# Patient Record
Sex: Female | Born: 1985
Health system: Southern US, Community
[De-identification: ages and names within clinical notes are randomized; demographics above are authoritative.]

## PROBLEM LIST (undated history)

## (undated) ENCOUNTER — Inpatient Hospital Stay (HOSPITAL_COMMUNITY): Payer: Self-pay

## (undated) DIAGNOSIS — Z3689 Encounter for other specified antenatal screening: Secondary | ICD-10-CM

## (undated) DIAGNOSIS — I10 Essential (primary) hypertension: Secondary | ICD-10-CM

## (undated) DIAGNOSIS — O4100X0 Oligohydramnios, unspecified trimester, not applicable or unspecified: Secondary | ICD-10-CM

## (undated) DIAGNOSIS — N390 Urinary tract infection, site not specified: Secondary | ICD-10-CM

## (undated) DIAGNOSIS — Z8619 Personal history of other infectious and parasitic diseases: Secondary | ICD-10-CM

## (undated) DIAGNOSIS — O139 Gestational [pregnancy-induced] hypertension without significant proteinuria, unspecified trimester: Secondary | ICD-10-CM

## (undated) HISTORY — DX: Personal history of other infectious and parasitic diseases: Z86.19

## (undated) HISTORY — PX: MOUTH SURGERY: SHX715

## (undated) HISTORY — PX: WISDOM TOOTH EXTRACTION: SHX21

---
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
## 2012-10-07 NOTE — L&D Delivery Note (Signed)
Delivery Note At 1:44 PM a viable and healthy female was delivered via  (Presentation:Direct Occiput ; Anterior ).  APGAR:9, 9; weight pending.   Placenta status: Spontaneous, Intact.  Cord: 3V cord  Anesthesia:  Epidural Episiotomy:   None Lacerations: 2nd degree right labial Suture Repair: 3.0 vicryl & 4-0 vicryl Est. Blood Loss (mL): 300cc  Mom to postpartum.  Baby to nursery-stable.  [name] H. 05/10/2013, 2:14 PM

## 2012-10-22 LAB — OB RESULTS CONSOLE HEPATITIS B SURFACE ANTIGEN: Hepatitis B Surface Ag: NEGATIVE

## 2012-10-22 LAB — OB RESULTS CONSOLE ABO/RH: RH Type: POSITIVE

## 2012-10-22 LAB — OB RESULTS CONSOLE ANTIBODY SCREEN: Antibody Screen: NEGATIVE

## 2012-10-22 LAB — OB RESULTS CONSOLE HIV ANTIBODY (ROUTINE TESTING): HIV: NONREACTIVE

## 2012-10-22 LAB — OB RESULTS CONSOLE GC/CHLAMYDIA
Chlamydia: NEGATIVE
Gonorrhea: NEGATIVE

## 2012-10-22 LAB — OB RESULTS CONSOLE RUBELLA ANTIBODY, IGM: Rubella: IMMUNE

## 2012-10-22 LAB — OB RESULTS CONSOLE RPR: RPR: NONREACTIVE

## 2013-04-02 LAB — OB RESULTS CONSOLE GBS: GBS: NEGATIVE

## 2013-05-01 ENCOUNTER — Inpatient Hospital Stay (HOSPITAL_COMMUNITY): Admission: AD | Admit: 2013-05-01 | Payer: Self-pay | Source: Ambulatory Visit | Admitting: Obstetrics and Gynecology

## 2013-05-05 ENCOUNTER — Telehealth (HOSPITAL_COMMUNITY): Payer: Self-pay | Admitting: *Deleted

## 2013-05-05 ENCOUNTER — Encounter (HOSPITAL_COMMUNITY): Payer: Self-pay | Admitting: *Deleted

## 2013-05-05 NOTE — Telephone Encounter (Signed)
Preadmission screen  

## 2013-05-09 ENCOUNTER — Encounter (HOSPITAL_COMMUNITY): Payer: Self-pay

## 2013-05-09 ENCOUNTER — Inpatient Hospital Stay (HOSPITAL_COMMUNITY)
Admission: RE | Admit: 2013-05-09 | Discharge: 2013-05-11 | DRG: 775 | Disposition: A | Discharge status: Home / Self Care | Payer: 59 | Source: Ambulatory Visit | Attending: Obstetrics and Gynecology | Admitting: Obstetrics and Gynecology

## 2013-05-09 VITALS — BP 122/81 | HR 71 | Temp 97.9°F | Resp 18 | Ht 67.0 in | Wt 195.0 lb

## 2013-05-09 DIAGNOSIS — Z3480 Encounter for supervision of other normal pregnancy, unspecified trimester: Secondary | ICD-10-CM

## 2013-05-09 DIAGNOSIS — O480 Post-term pregnancy: Principal | ICD-10-CM | POA: Diagnosis present

## 2013-05-09 LAB — COMPREHENSIVE METABOLIC PANEL
ALT: 7 U/L (ref 0–35)
AST: 14 U/L (ref 0–37)
Alkaline Phosphatase: 186 U/L — ABNORMAL HIGH (ref 39–117)
CO2: 18 mEq/L — ABNORMAL LOW (ref 19–32)
Calcium: 9.4 mg/dL (ref 8.4–10.5)
Glucose, Bld: 84 mg/dL (ref 70–99)
Potassium: 3.7 mEq/L (ref 3.5–5.1)
Sodium: 131 mEq/L — ABNORMAL LOW (ref 135–145)
Total Protein: 6.5 g/dL (ref 6.0–8.3)

## 2013-05-09 LAB — CBC
HCT: 33.5 % — ABNORMAL LOW (ref 36.0–46.0)
Hemoglobin: 11.3 g/dL — ABNORMAL LOW (ref 12.0–15.0)
MCHC: 33.7 g/dL (ref 30.0–36.0)
WBC: 13.8 10*3/uL — ABNORMAL HIGH (ref 4.0–10.5)

## 2013-05-09 MED ORDER — LACTATED RINGERS IV SOLN
INTRAVENOUS | Status: DC
  Administered 2013-05-09: 20:00:00 via INTRAVENOUS

## 2013-05-09 MED ORDER — TERBUTALINE SULFATE 1 MG/ML IJ SOLN: 0.2500 mg | Freq: Once | INTRAMUSCULAR | Status: AC | PRN

## 2013-05-09 MED ORDER — OXYTOCIN BOLUS FROM INFUSION: 500.0000 mL | INTRAVENOUS | Status: DC

## 2013-05-09 MED ORDER — IBUPROFEN 600 MG PO TABS: 600.0000 mg | ORAL_TABLET | Freq: Four times a day (QID) | ORAL | Status: DC | PRN

## 2013-05-09 MED ORDER — OXYCODONE-ACETAMINOPHEN 5-325 MG PO TABS: 1.0000 | ORAL_TABLET | ORAL | Status: DC | PRN

## 2013-05-09 MED ORDER — CITRIC ACID-SODIUM CITRATE 334-500 MG/5ML PO SOLN: 30.0000 mL | ORAL | Status: DC | PRN

## 2013-05-09 MED ORDER — LIDOCAINE HCL (PF) 1 % IJ SOLN
30.0000 mL | INTRAMUSCULAR | Status: DC | PRN
  Administered 2013-05-10: 30 mL via SUBCUTANEOUS
  Filled 2013-05-09 (×2): qty 30

## 2013-05-09 MED ORDER — ONDANSETRON HCL 4 MG/2ML IJ SOLN: 4.0000 mg | Freq: Four times a day (QID) | INTRAMUSCULAR | Status: DC | PRN

## 2013-05-09 MED ORDER — MISOPROSTOL 25 MCG QUARTER TABLET
25.0000 ug | ORAL_TABLET | ORAL | Status: DC | PRN
  Administered 2013-05-09: 25 ug via VAGINAL
  Filled 2013-05-09: qty 0.25
  Filled 2013-05-09: qty 1

## 2013-05-09 MED ORDER — LACTATED RINGERS IV SOLN: 500.0000 mL | INTRAVENOUS | Status: DC | PRN

## 2013-05-09 MED ORDER — FLEET ENEMA 7-19 GM/118ML RE ENEM: 1.0000 | ENEMA | RECTAL | Status: DC | PRN

## 2013-05-09 MED ORDER — ZOLPIDEM TARTRATE 5 MG PO TABS: 5.0000 mg | ORAL_TABLET | Freq: Every evening | ORAL | Status: DC | PRN

## 2013-05-09 MED ORDER — BUTORPHANOL TARTRATE 1 MG/ML IJ SOLN
1.0000 mg | INTRAMUSCULAR | Status: DC | PRN
  Administered 2013-05-10: 1 mg via INTRAVENOUS
  Filled 2013-05-09: qty 1

## 2013-05-09 MED ORDER — OXYTOCIN 40 UNITS IN LACTATED RINGERS INFUSION - SIMPLE MED: 62.5000 mL/h | INTRAVENOUS | Status: DC

## 2013-05-09 MED ORDER — ACETAMINOPHEN 325 MG PO TABS: 650.0000 mg | ORAL_TABLET | ORAL | Status: DC | PRN

## 2013-05-09 NOTE — H&P (Signed)
27 y.o. [redacted]w[redacted]d  G1P0 comes in for post dates induction.  Otherwise has good fetal movement and no bleeding.  Past Medical History  Diagnosis Date  . Hx of varicella     Past Surgical History  Procedure Laterality Date  . Mouth surgery      OB History   Grav Para Term Preterm Abortions TAB SAB Ect Mult Living   1              # Outc Date GA Lbr Len/2nd Wgt Sex Del Anes PTL Lv   1 CUR               History   Social History  . Marital Status: Married    Spouse Name: N/A    Number of Children: N/A  . Years of Education: N/A   Occupational History  . Not on file.   Social History Main Topics  . Smoking status: Never Smoker   . Smokeless tobacco: Never Used  . Alcohol Use: No  . Drug Use: No  . Sexually Active: Yes   Other Topics Concern  . Not on file   Social History Narrative  . No narrative on file   Minocycline    Prenatal Transfer Tool  Maternal Diabetes: No Genetic Screening: Normal Maternal Ultrasounds/Referrals: Normal Fetal Ultrasounds or other Referrals:  None Maternal Substance Abuse:  No Significant Maternal Medications:  None Significant Maternal Lab Results: None  Other [id number].    Filed Vitals:   05/09/13 1951 05/09/13 1955 05/09/13 2046  BP: 148/90  137/91  Pulse: 90  82  Temp: 97.9 F (36.6 C)    Resp: 20  20  Height:  5\' 7"  (1.702 m)   Weight:  88.451 kg (195 lb)     Lungs/Cor:  NAD Abdomen:  soft, gravid Ex:  no cords, erythema SVE:  FT/long/closed FHTs:  120, good STV, NST R Toco:  q5-10   A/P   Post dates induction. Now mildly elevated BPs.  Check PIH labs.    GBS neg.  Treanna Dumler A

## 2013-05-10 ENCOUNTER — Inpatient Hospital Stay (HOSPITAL_COMMUNITY): Payer: 59 | Admitting: Anesthesiology

## 2013-05-10 ENCOUNTER — Encounter (HOSPITAL_COMMUNITY): Payer: Self-pay

## 2013-05-10 ENCOUNTER — Encounter (HOSPITAL_COMMUNITY): Payer: Self-pay | Admitting: Anesthesiology

## 2013-05-10 LAB — RPR: RPR Ser Ql: NONREACTIVE

## 2013-05-10 MED ORDER — METHYLERGONOVINE MALEATE 0.2 MG/ML IJ SOLN: 0.2000 mg | INTRAMUSCULAR | Status: DC | PRN

## 2013-05-10 MED ORDER — DIPHENHYDRAMINE HCL 50 MG/ML IJ SOLN: 12.5000 mg | INTRAMUSCULAR | Status: DC | PRN

## 2013-05-10 MED ORDER — LANOLIN HYDROUS EX OINT: TOPICAL_OINTMENT | CUTANEOUS | Status: DC | PRN

## 2013-05-10 MED ORDER — OXYTOCIN 40 UNITS IN LACTATED RINGERS INFUSION - SIMPLE MED
1.0000 m[IU]/min | INTRAVENOUS | Status: DC
  Administered 2013-05-10: 2 m[IU]/min via INTRAVENOUS
  Filled 2013-05-10: qty 1000

## 2013-05-10 MED ORDER — TETANUS-DIPHTH-ACELL PERTUSSIS 5-2.5-18.5 LF-MCG/0.5 IM SUSP
0.5000 mL | Freq: Once | INTRAMUSCULAR | Status: AC
  Administered 2013-05-11: 0.5 mL via INTRAMUSCULAR
  Filled 2013-05-10: qty 0.5

## 2013-05-10 MED ORDER — DIPHENHYDRAMINE HCL 25 MG PO CAPS: 25.0000 mg | ORAL_CAPSULE | Freq: Four times a day (QID) | ORAL | Status: DC | PRN

## 2013-05-10 MED ORDER — IBUPROFEN 600 MG PO TABS
600.0000 mg | ORAL_TABLET | Freq: Four times a day (QID) | ORAL | Status: DC
  Administered 2013-05-10 – 2013-05-11 (×4): 600 mg via ORAL
  Filled 2013-05-10 (×4): qty 1

## 2013-05-10 MED ORDER — PHENYLEPHRINE 40 MCG/ML (10ML) SYRINGE FOR IV PUSH (FOR BLOOD PRESSURE SUPPORT)
80.0000 ug | PREFILLED_SYRINGE | INTRAVENOUS | Status: DC | PRN
  Filled 2013-05-10: qty 2

## 2013-05-10 MED ORDER — BENZOCAINE-MENTHOL 20-0.5 % EX AERO
1.0000 "application " | INHALATION_SPRAY | CUTANEOUS | Status: DC | PRN
  Administered 2013-05-10 – 2013-05-11 (×2): 1 via TOPICAL
  Filled 2013-05-10 (×2): qty 56

## 2013-05-10 MED ORDER — DIBUCAINE 1 % RE OINT: 1.0000 "application " | TOPICAL_OINTMENT | RECTAL | Status: DC | PRN

## 2013-05-10 MED ORDER — SENNOSIDES-DOCUSATE SODIUM 8.6-50 MG PO TABS
2.0000 | ORAL_TABLET | Freq: Every day | ORAL | Status: DC
  Administered 2013-05-10: 2 via ORAL

## 2013-05-10 MED ORDER — FENTANYL 2.5 MCG/ML BUPIVACAINE 1/10 % EPIDURAL INFUSION (WH - ANES)
14.0000 mL/h | INTRAMUSCULAR | Status: DC | PRN
  Administered 2013-05-10: 14 mL/h via EPIDURAL
  Filled 2013-05-10: qty 125

## 2013-05-10 MED ORDER — WITCH HAZEL-GLYCERIN EX PADS: 1.0000 "application " | MEDICATED_PAD | CUTANEOUS | Status: DC | PRN

## 2013-05-10 MED ORDER — SODIUM BICARBONATE 8.4 % IV SOLN
INTRAVENOUS | Status: DC | PRN
  Administered 2013-05-10: 5 mL via EPIDURAL

## 2013-05-10 MED ORDER — EPHEDRINE 5 MG/ML INJ
10.0000 mg | INTRAVENOUS | Status: DC | PRN
  Filled 2013-05-10: qty 2
  Filled 2013-05-10: qty 4

## 2013-05-10 MED ORDER — LACTATED RINGERS IV SOLN: 500.0000 mL | Freq: Once | INTRAVENOUS | Status: DC

## 2013-05-10 MED ORDER — PHENYLEPHRINE 40 MCG/ML (10ML) SYRINGE FOR IV PUSH (FOR BLOOD PRESSURE SUPPORT)
80.0000 ug | PREFILLED_SYRINGE | INTRAVENOUS | Status: DC | PRN
  Filled 2013-05-10: qty 5
  Filled 2013-05-10: qty 2

## 2013-05-10 MED ORDER — PRENATAL MULTIVITAMIN CH
1.0000 | ORAL_TABLET | Freq: Every day | ORAL | Status: DC
  Administered 2013-05-11: 1 via ORAL
  Filled 2013-05-10: qty 1

## 2013-05-10 MED ORDER — ONDANSETRON HCL 4 MG/2ML IJ SOLN: 4.0000 mg | INTRAMUSCULAR | Status: DC | PRN

## 2013-05-10 MED ORDER — SIMETHICONE 80 MG PO CHEW: 80.0000 mg | CHEWABLE_TABLET | ORAL | Status: DC | PRN

## 2013-05-10 MED ORDER — METHYLERGONOVINE MALEATE 0.2 MG PO TABS: 0.2000 mg | ORAL_TABLET | ORAL | Status: DC | PRN

## 2013-05-10 MED ORDER — OXYCODONE-ACETAMINOPHEN 5-325 MG PO TABS: 1.0000 | ORAL_TABLET | ORAL | Status: DC | PRN

## 2013-05-10 MED ORDER — EPHEDRINE 5 MG/ML INJ
10.0000 mg | INTRAVENOUS | Status: DC | PRN
  Filled 2013-05-10: qty 2

## 2013-05-10 MED ORDER — ZOLPIDEM TARTRATE 5 MG PO TABS: 5.0000 mg | ORAL_TABLET | Freq: Every evening | ORAL | Status: DC | PRN

## 2013-05-10 MED ORDER — ONDANSETRON HCL 4 MG PO TABS: 4.0000 mg | ORAL_TABLET | ORAL | Status: DC | PRN

## 2013-05-10 NOTE — Progress Notes (Signed)
Patient ID: Peggy Lester, female   DOB: 07-14-1986, 27 y.o.   MRN: [id number]  S: Comfortable after epidural placement O:  Filed Vitals:   05/10/13 0430 05/10/13 0630 05/10/13 0807 05/10/13 0812  BP:  141/77 132/85   Pulse:  89 104 92  Temp:      Resp: 20 20    Height:      Weight:      SpO2:    98%   AOx3, NAD Gravid soft FHT 120-130 Reactive with accelerations, category I CVx 5-6/90/-2 Toco: Q3-4  AP 1) AROM clear fluid 2) Continue pit 3) FWB reassuring

## 2013-05-10 NOTE — Progress Notes (Signed)
FHTs 120s gstv, NST R  Toco q5  BPs 130s-140s/90s  Results for orders placed during the hospital encounter of 05/09/13 (from the past 24 hour(s))  CBC     Status: Abnormal   Collection Time    05/09/13  8:00 PM      Result Value Range   WBC 13.8 (*) 4.0 - 10.5 K/uL   RBC 4.05  3.87 - 5.11 MIL/uL   Hemoglobin 11.3 (*) 12.0 - 15.0 g/dL   HCT 16.1 (*) 09.6 - 04.5 %   MCV 82.7  78.0 - 100.0 fL   MCH 27.9  26.0 - 34.0 pg   MCHC 33.7  30.0 - 36.0 g/dL   RDW 40.9  81.1 - 91.4 %   Platelets 207  150 - 400 K/uL  COMPREHENSIVE METABOLIC PANEL     Status: Abnormal   Collection Time    05/09/13  8:00 PM      Result Value Range   Sodium 131 (*) 135 - 145 mEq/L   Potassium 3.7  3.5 - 5.1 mEq/L   Chloride 100  96 - 112 mEq/L   CO2 18 (*) 19 - 32 mEq/L   Glucose, Bld 84  70 - 99 mg/dL   BUN 4 (*) 6 - 23 mg/dL   Creatinine, Ser 7.82  0.50 - 1.10 mg/dL   Calcium 9.4  8.4 - 95.6 mg/dL   Total Protein 6.5  6.0 - 8.3 g/dL   Albumin 2.9 (*) 3.5 - 5.2 g/dL   AST 14  0 - 37 U/L   ALT 7  0 - 35 U/L   Alkaline Phosphatase 186 (*) 39 - 117 U/L   Total Bilirubin 0.2 (*) 0.3 - 1.2 mg/dL   GFR calc non Af Amer >90  >90 mL/min   GFR calc Af Amer >90  >90 mL/min  URIC ACID     Status: None   Collection Time    05/09/13  8:00 PM      Result Value Range   Uric Acid, Serum 5.0  2.4 - 7.0 mg/dL   Labs and BPs stable.  Pit at 0500.

## 2013-05-10 NOTE — Anesthesia Procedure Notes (Signed)

## 2013-05-10 NOTE — Anesthesia Preprocedure Evaluation (Signed)

## 2013-05-11 LAB — CBC
Hemoglobin: 9.3 g/dL — ABNORMAL LOW (ref 12.0–15.0)
MCHC: 33.2 g/dL (ref 30.0–36.0)
Platelets: 183 10*3/uL (ref 150–400)
RDW: 14.3 % (ref 11.5–15.5)

## 2013-05-11 NOTE — Anesthesia Postprocedure Evaluation (Signed)
  Anesthesia Post-op Note  Patient: Peggy Lester  Procedure(s) Performed: * No procedures listed *  Patient Location: Mother/Baby  Anesthesia Type:Epidural  Level of Consciousness: awake  Airway and Oxygen Therapy: Patient Spontanous Breathing  Post-op Pain: none  Post-op Assessment: Patient's Cardiovascular Status Stable, Respiratory Function Stable, Patent Airway, No signs of Nausea or vomiting, Adequate PO intake, Pain level controlled, No headache, No backache, No residual numbness and No residual motor weakness  Post-op Vital Signs: Reviewed and stable  Complications: No apparent anesthesia complications

## 2013-05-11 NOTE — Discharge Summary (Signed)
Obstetric Discharge Summary Reason for Admission: induction of labor Prenatal Procedures: ultrasound Intrapartum Procedures: spontaneous vaginal delivery Postpartum Procedures: none Complications-Operative and Postpartum: 2 degree perineal laceration Hemoglobin  Date Value Range Status  05/11/2013 9.3* 12.0 - 15.0 g/dL Final     DELTA CHECK NOTED     REPEATED TO VERIFY     HCT  Date Value Range Status  05/11/2013 28.0* 36.0 - 46.0 % Final    Physical Exam:  General: alert Lochia: appropriate Uterine Fundus: firm   Discharge Diagnoses: Term Pregnancy-delivered  Discharge Information: Date: 05/11/2013 Activity: pelvic rest Diet: routine Medications: PNV and Ibuprofen Condition: stable Instructions: refer to practice specific booklet Discharge to: home Follow-up Information   Follow up with Almon Hercules., MD. Schedule an appointment as soon as possible for a visit in 4 weeks.   Contact information:   [address] [place] [address] [phone]       Newborn Data: Live born female  Birth Weight: 7 lb 6 oz (3345 g) APGAR: 9, 9  Home with mother.  Atharv Barriere E 05/11/2013, 8:56 AM

## 2013-05-11 NOTE — Progress Notes (Signed)
PPD#1 Pt would like to go home. Baby and mother doing well. VSSAf IMP/ stable PLAN/ Will discharge.

## 2014-08-08 ENCOUNTER — Encounter (HOSPITAL_COMMUNITY): Payer: Self-pay

## 2016-09-03 DIAGNOSIS — Z3480 Encounter for supervision of other normal pregnancy, unspecified trimester: Secondary | ICD-10-CM | POA: Diagnosis not present

## 2016-09-03 DIAGNOSIS — Z01419 Encounter for gynecological examination (general) (routine) without abnormal findings: Secondary | ICD-10-CM | POA: Diagnosis not present

## 2016-09-03 DIAGNOSIS — Z6829 Body mass index (BMI) 29.0-29.9, adult: Secondary | ICD-10-CM | POA: Diagnosis not present

## 2016-09-03 DIAGNOSIS — Z124 Encounter for screening for malignant neoplasm of cervix: Secondary | ICD-10-CM | POA: Diagnosis not present

## 2016-09-03 DIAGNOSIS — N925 Other specified irregular menstruation: Secondary | ICD-10-CM | POA: Diagnosis not present

## 2016-09-05 MED FILL — CEPHALEXIN 500 MG CAPSULE: 500 | 7 days supply | Qty: 14 | Fill #0

## 2016-09-26 DIAGNOSIS — Z3481 Encounter for supervision of other normal pregnancy, first trimester: Secondary | ICD-10-CM | POA: Diagnosis not present

## 2016-09-26 DIAGNOSIS — Z3480 Encounter for supervision of other normal pregnancy, unspecified trimester: Secondary | ICD-10-CM | POA: Diagnosis not present

## 2016-09-26 LAB — OB RESULTS CONSOLE RPR: RPR: NONREACTIVE

## 2016-09-26 LAB — OB RESULTS CONSOLE GC/CHLAMYDIA
CHLAMYDIA, DNA PROBE: NEGATIVE
Gonorrhea: NEGATIVE

## 2016-09-26 LAB — OB RESULTS CONSOLE ABO/RH: RH TYPE: POSITIVE

## 2016-09-26 LAB — OB RESULTS CONSOLE HEPATITIS B SURFACE ANTIGEN: HEP B S AG: NEGATIVE

## 2016-09-26 LAB — OB RESULTS CONSOLE RUBELLA ANTIBODY, IGM: RUBELLA: IMMUNE

## 2016-09-26 LAB — OB RESULTS CONSOLE HIV ANTIBODY (ROUTINE TESTING): HIV: NONREACTIVE

## 2016-09-26 LAB — OB RESULTS CONSOLE ANTIBODY SCREEN: Antibody Screen: NEGATIVE

## 2016-09-27 MED FILL — NIFEDIPINE ER 30 MG TABLET: 30 | 30 days supply | Qty: 30 | Fill #0

## 2016-10-07 NOTE — L&D Delivery Note (Signed)
Pt was admitted last pm for an induction secondary to HTN and low amniotic fluid. She was given 2 doses of cytotec. In the am she had an amniotomy with clear fluid. Pit aug was started. She progressed slowly to 3cm then rapidly completed the first stage. She pushed 3 times and had a SVD of one live viable white  infant over an intact perineum. Placenta-S/I. EBL-400cc. Baby to NBN. No complications.

## 2016-10-28 MED FILL — NIFEDIPINE ER 30 MG TABLET: 30 | 30 days supply | Qty: 30 | Fill #1

## 2016-10-30 DIAGNOSIS — Z3480 Encounter for supervision of other normal pregnancy, unspecified trimester: Secondary | ICD-10-CM | POA: Diagnosis not present

## 2016-10-30 DIAGNOSIS — R319 Hematuria, unspecified: Secondary | ICD-10-CM | POA: Diagnosis not present

## 2016-11-04 MED FILL — NITROFURANTOIN MONO-MCR 100: 100 | 7 days supply | Qty: 14 | Fill #0

## 2016-11-21 DIAGNOSIS — Z369 Encounter for antenatal screening, unspecified: Secondary | ICD-10-CM | POA: Diagnosis not present

## 2016-11-21 DIAGNOSIS — Z363 Encounter for antenatal screening for malformations: Secondary | ICD-10-CM | POA: Diagnosis not present

## 2016-11-21 DIAGNOSIS — Z3480 Encounter for supervision of other normal pregnancy, unspecified trimester: Secondary | ICD-10-CM | POA: Diagnosis not present

## 2016-11-27 MED FILL — NITROFURANTOIN MONO-MCR 100: 100 | 7 days supply | Qty: 14 | Fill #0

## 2016-11-28 MED FILL — NIFEDIPINE ER 30 MG TABLET: 30 | 30 days supply | Qty: 30 | Fill #2

## 2016-12-19 DIAGNOSIS — O359XX1 Maternal care for (suspected) fetal abnormality and damage, unspecified, fetus 1: Secondary | ICD-10-CM | POA: Diagnosis not present

## 2016-12-31 MED FILL — NIFEDIPINE ER 30 MG TABLET: 30 | 30 days supply | Qty: 30 | Fill #0

## 2017-01-28 DIAGNOSIS — R030 Elevated blood-pressure reading, without diagnosis of hypertension: Secondary | ICD-10-CM | POA: Diagnosis not present

## 2017-01-28 DIAGNOSIS — Z3480 Encounter for supervision of other normal pregnancy, unspecified trimester: Secondary | ICD-10-CM | POA: Diagnosis not present

## 2017-01-30 ENCOUNTER — Encounter (HOSPITAL_COMMUNITY): Payer: Self-pay | Admitting: *Deleted

## 2017-01-30 MED FILL — NIFEDIPINE ER 30 MG TABLET: 30 | 30 days supply | Qty: 30 | Fill #1

## 2017-01-31 ENCOUNTER — Encounter (HOSPITAL_COMMUNITY): Payer: Self-pay

## 2017-01-31 ENCOUNTER — Other Ambulatory Visit (HOSPITAL_COMMUNITY): Payer: Self-pay | Admitting: Obstetrics & Gynecology

## 2017-01-31 ENCOUNTER — Ambulatory Visit (HOSPITAL_COMMUNITY)
Admission: RE | Admit: 2017-01-31 | Discharge: 2017-01-31 | Disposition: A | Discharge status: Home / Self Care | Payer: 59 | Source: Ambulatory Visit | Attending: Obstetrics & Gynecology | Admitting: Obstetrics & Gynecology

## 2017-01-31 DIAGNOSIS — Z3689 Encounter for other specified antenatal screening: Secondary | ICD-10-CM

## 2017-01-31 DIAGNOSIS — O365930 Maternal care for other known or suspected poor fetal growth, third trimester, not applicable or unspecified: Secondary | ICD-10-CM | POA: Diagnosis not present

## 2017-01-31 DIAGNOSIS — O10013 Pre-existing essential hypertension complicating pregnancy, third trimester: Secondary | ICD-10-CM

## 2017-01-31 DIAGNOSIS — O09893 Supervision of other high risk pregnancies, third trimester: Secondary | ICD-10-CM | POA: Diagnosis not present

## 2017-01-31 DIAGNOSIS — Z3A29 29 weeks gestation of pregnancy: Secondary | ICD-10-CM

## 2017-01-31 DIAGNOSIS — Z3A28 28 weeks gestation of pregnancy: Secondary | ICD-10-CM | POA: Diagnosis not present

## 2017-01-31 DIAGNOSIS — Q02 Microcephaly: Secondary | ICD-10-CM | POA: Diagnosis not present

## 2017-01-31 DIAGNOSIS — O365931 Maternal care for other known or suspected poor fetal growth, third trimester, fetus 1: Secondary | ICD-10-CM

## 2017-01-31 DIAGNOSIS — Z364 Encounter for antenatal screening for fetal growth retardation: Secondary | ICD-10-CM | POA: Insufficient documentation

## 2017-01-31 DIAGNOSIS — O283 Abnormal ultrasonic finding on antenatal screening of mother: Secondary | ICD-10-CM

## 2017-01-31 HISTORY — DX: Essential (primary) hypertension: I10

## 2017-01-31 NOTE — Consult Note (Signed)
Maternal Fetal Medicine Consultation  Requesting Provider(s): Essie Hart, MD  Reason for consultation: Chronic hypertension, poor fetal growth and dolicocephaly  HPI: Peggy Lester is a 31 yo G2P1, LMP 07/16/2016 who is currently at 28w 3d seen for evaluation due to suspected fetal growth restriction, dolicocephaly and chronic hypertension.  She was first diagnosed with chronic hypertension early in pregnancy - at first OB visit had a blood pressure of 160/100.  She is currently on Procardia XL 30 mg daily.  Her blood pressures initially improved, but over the last several weeks have frequently been in the 140/90 range.  She is without complaints today.  The fetus is active.  She denies HA, visual changes, RUQ pain or other s/sx of preeclampsia.    OB History: OB History    Gravida Para Term Preterm AB Living   SAB TAB Ectopic Multiple Live Births           1      PMH:  Past Medical History:  Diagnosis Date  . Hx of varicella   . Hypertension     PSH:  Past Surgical History:  Procedure Laterality Date  . MOUTH SURGERY     Meds:  Current Outpatient Prescriptions on File Prior to Encounter  Medication Sig Dispense Refill  . Prenatal Vit-Fe Fumarate-FA (PRENATAL MULTIVITAMIN) TABS Take 1 tablet by mouth daily at 12 noon.     No current facility-administered medications on file prior to encounter.    Procardia XL 30 mg daily  Allergies:  Allergies  Allergen Reactions  . Minocycline Hives   FH:  Family History  Problem Relation Age of Onset  . Hypertension Mother   . Diabetes Maternal Grandmother   . Down syndrome Cousin    Soc:  Social History   Social History  . Marital status: Married    Spouse name: N/A  . Number of children: N/A  . Years of education: N/A   Occupational History  . Not on file.   Social History Main Topics  . Smoking status: Never Smoker  . Smokeless tobacco: Never Used  . Alcohol use No  . Drug use: No  . Sexual activity:  Yes   Other Topics Concern  . Not on file   Social History Narrative  . No narrative on file    Review of Systems: no vaginal bleeding or cramping/contractions, no LOF, no nausea/vomiting. All other systems reviewed and are negative.  PE:   Vitals:   01/31/17 0934 01/31/17 1043  BP: (!) 150/93 (!) 137/92  Pulse: (!) 121 (!) 105    GEN: well-appearing female ABD: gravid, NT  Ultrasound:  Single IUP at 28w 3d Chronic hypertension Limited views of the fetal spine obtained.  The remainder of the fetal anatomy appears normal. The overall estimated fetal weight is at the 30th %tile.  The AC measures at the 22nd %tile. The HC measures < the 3rd %tile but well above 2SD below the mean for gestational age (cut-off used for microcephaly) Anterior placenta without previa Normal amniotic fluid volume   A/P: 1) Single IUP at 28w 3d  2) Suspected lagging growth, dolicocephaly - the estimated fetal weight on today's study was at the 30th % with an AC at the 22nd %tile.  The overall HC measures < 3rd%, but still within normal limits (well above 2SD below the mean for gestational age that is usually used for the diagnosis of microcephaly).  There does  not appear to be any significant dolicocephaly - the patient does report that the fetus was breech during her most recent ultrasound that might account for this finding.  Given the patient's history of chronic hypertension, would recommend follow up in 4 weeks with MFM to reevaluate growth and the fetuses head circumference.  3) Chronic hypertension - Ms. Shonk was first diagnosed with chronic hypertension this pregnancy with a BP of 160/100 during her initial OB visit.  She is currently on Procardia XL 30 mg daily.  Her initial blood pressure today was somewhat elevated, but improved somewhat on repeat.  If not yet done, would recommend baseline preeeclampsia labs and a 24-hr urine protein collection.  Would recommend repeat BP next week - if still  elevated, would recommend an evaluation for superimposed preeclampsia.  May increase Procardia XL dose to 60 mg (assuming preeclampsia labs are normal).  Ms. Yano will need serial ultrasounds for growth every 3-4 weeks and antenatal testing beginning at [redacted] weeks gestation (either 2x weekly NSTs with weekly AFIs or weekly BPPs).  Recommend delivery at 38-39 weeks or earlier based on the clinical scenario.    Thank you for the opportunity to be a part of the care of TOMEKIA HELTON. Please contact our office if we can be of further assistance.   I spent approximately 30 minutes with this patient with over 50% of time spent in face-to-face counseling.  Alpha Gula, MD Maternal Fetal Medicine

## 2017-02-04 DIAGNOSIS — I10 Essential (primary) hypertension: Secondary | ICD-10-CM | POA: Diagnosis not present

## 2017-02-07 ENCOUNTER — Other Ambulatory Visit: Payer: Self-pay

## 2017-02-07 DIAGNOSIS — N390 Urinary tract infection, site not specified: Secondary | ICD-10-CM | POA: Diagnosis not present

## 2017-02-07 DIAGNOSIS — R030 Elevated blood-pressure reading, without diagnosis of hypertension: Secondary | ICD-10-CM | POA: Diagnosis not present

## 2017-02-07 MED FILL — NITROFURANTOIN MONO-MCR 100: 100 | 30 days supply | Qty: 60 | Fill #0

## 2017-02-10 DIAGNOSIS — O358XX0 Maternal care for other (suspected) fetal abnormality and damage, not applicable or unspecified: Secondary | ICD-10-CM | POA: Diagnosis not present

## 2017-02-12 DIAGNOSIS — O4103X1 Oligohydramnios, third trimester, fetus 1: Secondary | ICD-10-CM | POA: Diagnosis not present

## 2017-02-18 DIAGNOSIS — O4103X1 Oligohydramnios, third trimester, fetus 1: Secondary | ICD-10-CM | POA: Diagnosis not present

## 2017-02-21 ENCOUNTER — Inpatient Hospital Stay (HOSPITAL_COMMUNITY)
Admission: AD | Admit: 2017-02-21 | Discharge: 2017-02-21 | Disposition: A | Discharge status: Home / Self Care | Payer: 59 | Source: Ambulatory Visit | Attending: Obstetrics and Gynecology | Admitting: Obstetrics and Gynecology

## 2017-02-21 DIAGNOSIS — Z3A31 31 weeks gestation of pregnancy: Secondary | ICD-10-CM | POA: Diagnosis not present

## 2017-02-21 DIAGNOSIS — O4103X0 Oligohydramnios, third trimester, not applicable or unspecified: Secondary | ICD-10-CM | POA: Insufficient documentation

## 2017-02-21 DIAGNOSIS — O4103X1 Oligohydramnios, third trimester, fetus 1: Secondary | ICD-10-CM | POA: Diagnosis not present

## 2017-02-21 DIAGNOSIS — O26893 Other specified pregnancy related conditions, third trimester: Secondary | ICD-10-CM | POA: Insufficient documentation

## 2017-02-21 DIAGNOSIS — I10 Essential (primary) hypertension: Secondary | ICD-10-CM | POA: Insufficient documentation

## 2017-02-21 DIAGNOSIS — Z23 Encounter for immunization: Secondary | ICD-10-CM | POA: Diagnosis not present

## 2017-02-21 MED ORDER — BETAMETHASONE SOD PHOS & ACET 6 (3-3) MG/ML IJ SUSP
12.0000 mg | Freq: Once | INTRAMUSCULAR | Status: AC
  Administered 2017-02-21: 12 mg via INTRAMUSCULAR
  Filled 2017-02-21: qty 2

## 2017-02-21 NOTE — MAU Note (Signed)
Dr Mora ApplPinn had called, pt sent in for Betamethasone today and repeat tomorrow.

## 2017-02-22 ENCOUNTER — Inpatient Hospital Stay (HOSPITAL_COMMUNITY)
Admission: AD | Admit: 2017-02-22 | Discharge: 2017-02-22 | Disposition: A | Discharge status: Home / Self Care | Payer: 59 | Source: Ambulatory Visit | Attending: Obstetrics & Gynecology | Admitting: Obstetrics & Gynecology

## 2017-02-22 DIAGNOSIS — Z3A31 31 weeks gestation of pregnancy: Secondary | ICD-10-CM | POA: Diagnosis not present

## 2017-02-22 DIAGNOSIS — O133 Gestational [pregnancy-induced] hypertension without significant proteinuria, third trimester: Secondary | ICD-10-CM | POA: Insufficient documentation

## 2017-02-22 MED ORDER — BETAMETHASONE SOD PHOS & ACET 6 (3-3) MG/ML IJ SUSP
12.0000 mg | Freq: Once | INTRAMUSCULAR | Status: AC
  Administered 2017-02-22: 12 mg via INTRAMUSCULAR
  Filled 2017-02-22: qty 2

## 2017-02-24 DIAGNOSIS — O4103X1 Oligohydramnios, third trimester, fetus 1: Secondary | ICD-10-CM | POA: Diagnosis not present

## 2017-02-28 ENCOUNTER — Other Ambulatory Visit (HOSPITAL_COMMUNITY): Payer: Self-pay | Admitting: *Deleted

## 2017-02-28 ENCOUNTER — Encounter (HOSPITAL_COMMUNITY): Payer: Self-pay

## 2017-02-28 ENCOUNTER — Other Ambulatory Visit (HOSPITAL_COMMUNITY): Payer: Self-pay | Admitting: Maternal and Fetal Medicine

## 2017-02-28 ENCOUNTER — Ambulatory Visit (HOSPITAL_COMMUNITY)
Admission: RE | Admit: 2017-02-28 | Discharge: 2017-02-28 | Disposition: A | Discharge status: Home / Self Care | Payer: 59 | Source: Ambulatory Visit | Attending: Obstetrics & Gynecology | Admitting: Obstetrics & Gynecology

## 2017-02-28 DIAGNOSIS — O10913 Unspecified pre-existing hypertension complicating pregnancy, third trimester: Secondary | ICD-10-CM

## 2017-02-28 DIAGNOSIS — Z3A32 32 weeks gestation of pregnancy: Secondary | ICD-10-CM | POA: Diagnosis not present

## 2017-02-28 DIAGNOSIS — O10013 Pre-existing essential hypertension complicating pregnancy, third trimester: Secondary | ICD-10-CM | POA: Diagnosis not present

## 2017-02-28 DIAGNOSIS — Z79899 Other long term (current) drug therapy: Secondary | ICD-10-CM | POA: Insufficient documentation

## 2017-02-28 DIAGNOSIS — O4103X0 Oligohydramnios, third trimester, not applicable or unspecified: Secondary | ICD-10-CM | POA: Diagnosis not present

## 2017-02-28 DIAGNOSIS — O10919 Unspecified pre-existing hypertension complicating pregnancy, unspecified trimester: Secondary | ICD-10-CM

## 2017-02-28 DIAGNOSIS — Z362 Encounter for other antenatal screening follow-up: Secondary | ICD-10-CM

## 2017-03-04 DIAGNOSIS — O4103X1 Oligohydramnios, third trimester, fetus 1: Secondary | ICD-10-CM | POA: Diagnosis not present

## 2017-03-11 ENCOUNTER — Encounter (HOSPITAL_COMMUNITY): Payer: Self-pay | Admitting: *Deleted

## 2017-03-11 ENCOUNTER — Inpatient Hospital Stay (HOSPITAL_COMMUNITY)
Admission: AD | Admit: 2017-03-11 | Discharge: 2017-03-11 | Disposition: A | Discharge status: Home / Self Care | Payer: BLUE CROSS/BLUE SHIELD | Source: Ambulatory Visit | Attending: Obstetrics and Gynecology | Admitting: Obstetrics and Gynecology

## 2017-03-11 DIAGNOSIS — O4103X0 Oligohydramnios, third trimester, not applicable or unspecified: Secondary | ICD-10-CM | POA: Insufficient documentation

## 2017-03-11 DIAGNOSIS — O133 Gestational [pregnancy-induced] hypertension without significant proteinuria, third trimester: Secondary | ICD-10-CM | POA: Diagnosis not present

## 2017-03-11 DIAGNOSIS — O4100X0 Oligohydramnios, unspecified trimester, not applicable or unspecified: Secondary | ICD-10-CM | POA: Diagnosis present

## 2017-03-11 DIAGNOSIS — Z3A34 34 weeks gestation of pregnancy: Secondary | ICD-10-CM | POA: Diagnosis not present

## 2017-03-11 DIAGNOSIS — Z3689 Encounter for other specified antenatal screening: Secondary | ICD-10-CM | POA: Diagnosis not present

## 2017-03-11 DIAGNOSIS — Z79899 Other long term (current) drug therapy: Secondary | ICD-10-CM | POA: Insufficient documentation

## 2017-03-11 HISTORY — DX: Oligohydramnios, unspecified trimester, not applicable or unspecified: O41.00X0

## 2017-03-11 HISTORY — DX: Encounter for other specified antenatal screening: Z36.89

## 2017-03-11 HISTORY — DX: Urinary tract infection, site not specified: N39.0

## 2017-03-11 HISTORY — DX: Gestational (pregnancy-induced) hypertension without significant proteinuria, unspecified trimester: O13.9

## 2017-03-11 NOTE — MAU Provider Note (Signed)
History     CSN: [id number]  Arrival date and time: 03/11/17 1719   First Provider Initiated Contact with Patient 03/11/17 1813      Chief Complaint  Patient presents with  . fetal monitoring   HPI  Ms. Peggy Lester is a 31 yo G2P1001 at 34.[redacted] wks gestation sent from [facility] for prolonged monitoring d/t variables and decels on NST in the office. She has a high-risk pregnancy receiving twice weekly testing for oligohydramnios and CHTN.  She states that it took a tech 45 mins to get her on the monitor while she was in the office today.  She states the baby has been "very active" all afternoon, because she drank punch to make her more active before her appt. She reports the baby was moving all over.  Past Medical History:  Diagnosis Date  . Hx of varicella   . Hypertension   . Non-stress test reactive on fetal surveillance 03/11/2017  . Oligohydramnios 03/11/2017  . Pregnancy induced hypertension   . UTI (urinary tract infection)     Past Surgical History:  Procedure Laterality Date  . MOUTH SURGERY    . WISDOM TOOTH EXTRACTION      Family History  Problem Relation Age of Onset  . Hypertension Mother   . Diabetes Maternal Grandmother   . Down syndrome Cousin     Social History  Substance Use Topics  . Smoking status: Never Smoker  . Smokeless tobacco: Never Used  . Alcohol use No    Allergies:  Allergies  Allergen Reactions  . Minocycline Hives    Prescriptions Prior to Admission  Medication Sig Dispense Refill Last Dose  . nitrofurantoin, macrocrystal-monohydrate, (MACROBID) 100 MG capsule Take 100 mg by mouth at bedtime.   03/10/2017 at Unknown time  . Prenatal Vit-Fe Fumarate-FA (PRENATAL MULTIVITAMIN) TABS Take 1 tablet by mouth daily at 12 noon.   03/10/2017 at Unknown time    Review of Systems  Constitutional: Negative.   HENT: Negative.   Eyes: Negative.   Respiratory: Negative.   Cardiovascular: Negative.   Gastrointestinal: Negative.   Endocrine: Negative.    Genitourinary: Negative.   Musculoskeletal: Negative.   Skin: Negative.   Allergic/Immunologic: Negative.   Neurological: Negative.   Hematological: Negative.   Psychiatric/Behavioral: Negative.    Physical Exam   Blood pressure (!) 142/85, pulse (!) 109, temperature 98.5 F (36.9 C), resp. rate 18, height 5\' 4"  (1.626 m), weight 87.1 kg (192 lb), last menstrual period 07/16/2016. Physical Exam  Constitutional: She is oriented to person, place, and time. She appears well-developed and well-nourished.  HENT:  Head: Normocephalic.  Eyes: Pupils are equal, round, and reactive to light.  Neck: Normal range of motion.  Cardiovascular: Normal rate, regular rhythm, normal heart sounds and intact distal pulses.   Respiratory: Effort normal and breath sounds normal.  GI: Soft. Bowel sounds are normal.  Genitourinary:  Genitourinary Comments: Pelvic deferred  Musculoskeletal: Normal range of motion.  Neurological: She is alert and oriented to person, place, and time. She has normal reflexes.  Skin: Skin is warm and dry.  Psychiatric: She has a normal mood and affect. Her behavior is normal. Judgment and thought content normal.   CEFM  FHR: 140 bpm / moderate variability / accels present / decels absent TOCO: none  MAU Course  Procedures  MDM NST - reactive  Assessment and Plan  Non-stress test reactive on fetal surveillance - Discharge home - Fetal Kick Counts - Keep scheduled appt on 03/14/2017  Patient verbalized an understanding of the plan of care and agrees.  Raelyn Moraolitta Jamaiya Tunnell, MSN, CNM 03/11/2017, 7:00 PM

## 2017-03-11 NOTE — MAU Note (Signed)
Pt presents for prolonged monitoring after variables in the office today. Denies any VB, LOF or contractions.

## 2017-03-18 LAB — OB RESULTS CONSOLE GBS: GBS: POSITIVE

## 2017-03-21 DIAGNOSIS — O289 Unspecified abnormal findings on antenatal screening of mother: Secondary | ICD-10-CM | POA: Diagnosis not present

## 2017-03-25 ENCOUNTER — Other Ambulatory Visit: Payer: Self-pay | Admitting: Obstetrics & Gynecology

## 2017-03-26 ENCOUNTER — Telehealth (HOSPITAL_COMMUNITY): Payer: Self-pay | Admitting: *Deleted

## 2017-03-26 NOTE — Telephone Encounter (Signed)
Preadmission screen  

## 2017-03-28 ENCOUNTER — Encounter (HOSPITAL_COMMUNITY): Payer: Self-pay

## 2017-03-28 ENCOUNTER — Ambulatory Visit (HOSPITAL_COMMUNITY)
Admission: RE | Admit: 2017-03-28 | Discharge: 2017-03-28 | Disposition: A | Discharge status: Home / Self Care | Payer: 59 | Source: Ambulatory Visit | Attending: Obstetrics & Gynecology | Admitting: Obstetrics & Gynecology

## 2017-03-28 DIAGNOSIS — O4103X1 Oligohydramnios, third trimester, fetus 1: Secondary | ICD-10-CM | POA: Diagnosis not present

## 2017-03-28 DIAGNOSIS — I10 Essential (primary) hypertension: Secondary | ICD-10-CM | POA: Diagnosis not present

## 2017-04-01 ENCOUNTER — Encounter (HOSPITAL_COMMUNITY): Payer: Self-pay

## 2017-04-01 ENCOUNTER — Inpatient Hospital Stay (HOSPITAL_COMMUNITY): Payer: 59 | Admitting: Anesthesiology

## 2017-04-01 ENCOUNTER — Inpatient Hospital Stay (HOSPITAL_COMMUNITY)
Admission: RE | Admit: 2017-04-01 | Discharge: 2017-04-03 | DRG: 774 | Disposition: A | Discharge status: Home / Self Care | Payer: 59 | Source: Ambulatory Visit | Attending: Obstetrics and Gynecology | Admitting: Obstetrics and Gynecology

## 2017-04-01 VITALS — BP 126/87 | HR 88 | Temp 98.3°F | Resp 18 | Ht 67.0 in | Wt 198.0 lb

## 2017-04-01 DIAGNOSIS — Z87440 Personal history of urinary (tract) infections: Secondary | ICD-10-CM

## 2017-04-01 DIAGNOSIS — Z3689 Encounter for other specified antenatal screening: Secondary | ICD-10-CM

## 2017-04-01 DIAGNOSIS — I10 Essential (primary) hypertension: Secondary | ICD-10-CM | POA: Diagnosis present

## 2017-04-01 DIAGNOSIS — O1002 Pre-existing essential hypertension complicating childbirth: Secondary | ICD-10-CM | POA: Diagnosis present

## 2017-04-01 DIAGNOSIS — O4103X0 Oligohydramnios, third trimester, not applicable or unspecified: Secondary | ICD-10-CM | POA: Diagnosis not present

## 2017-04-01 DIAGNOSIS — Z3A37 37 weeks gestation of pregnancy: Secondary | ICD-10-CM

## 2017-04-01 DIAGNOSIS — Z3490 Encounter for supervision of normal pregnancy, unspecified, unspecified trimester: Secondary | ICD-10-CM

## 2017-04-01 DIAGNOSIS — O134 Gestational [pregnancy-induced] hypertension without significant proteinuria, complicating childbirth: Secondary | ICD-10-CM | POA: Diagnosis not present

## 2017-04-01 LAB — CBC
HCT: 29.8 % — ABNORMAL LOW (ref 36.0–46.0)
HCT: 30.1 % — ABNORMAL LOW (ref 36.0–46.0)
HEMATOCRIT: 29.5 % — AB (ref 36.0–46.0)
HEMOGLOBIN: 10.3 g/dL — AB (ref 12.0–15.0)
Hemoglobin: 10.1 g/dL — ABNORMAL LOW (ref 12.0–15.0)
Hemoglobin: 10.1 g/dL — ABNORMAL LOW (ref 12.0–15.0)
MCH: 28.7 pg (ref 26.0–34.0)
MCH: 28.9 pg (ref 26.0–34.0)
MCH: 29.1 pg (ref 26.0–34.0)
MCHC: 34.2 g/dL (ref 30.0–36.0)
MCHC: 33.9 g/dL (ref 30.0–36.0)
MCHC: 34.2 g/dL (ref 30.0–36.0)
MCV: 83.8 fL (ref 78.0–100.0)
MCV: 85.4 fL (ref 78.0–100.0)
MCV: 85 fL (ref 78.0–100.0)
PLATELETS: 203 10*3/uL (ref 150–400)
PLATELETS: 197 10*3/uL (ref 150–400)
Platelets: 225 10*3/uL (ref 150–400)
RBC: 3.52 MIL/uL — ABNORMAL LOW (ref 3.87–5.11)
RBC: 3.49 MIL/uL — ABNORMAL LOW (ref 3.87–5.11)
RBC: 3.54 MIL/uL — AB (ref 3.87–5.11)
RDW: 13.8 % (ref 11.5–15.5)
RDW: 13.8 % (ref 11.5–15.5)
RDW: 13.8 % (ref 11.5–15.5)
WBC: 13.6 10*3/uL — AB (ref 4.0–10.5)
WBC: 12.3 10*3/uL — AB (ref 4.0–10.5)
WBC: 18.6 10*3/uL — ABNORMAL HIGH (ref 4.0–10.5)

## 2017-04-01 LAB — RPR: RPR: NONREACTIVE

## 2017-04-01 LAB — TYPE AND SCREEN
ABO/RH(D): B POS
ANTIBODY SCREEN: NEGATIVE

## 2017-04-01 LAB — ABO/RH: ABO/RH(D): B POS

## 2017-04-01 MED ORDER — TERBUTALINE SULFATE 1 MG/ML IJ SOLN
0.2500 mg | Freq: Once | INTRAMUSCULAR | Status: DC | PRN
  Filled 2017-04-01: qty 1

## 2017-04-01 MED ORDER — OXYTOCIN 40 UNITS IN LACTATED RINGERS INFUSION - SIMPLE MED: 2.5000 [IU]/h | INTRAVENOUS | Status: DC

## 2017-04-01 MED ORDER — ACETAMINOPHEN 325 MG PO TABS: 650.0000 mg | ORAL_TABLET | ORAL | Status: DC | PRN

## 2017-04-01 MED ORDER — ONDANSETRON HCL 4 MG/2ML IJ SOLN: 4.0000 mg | Freq: Four times a day (QID) | INTRAMUSCULAR | Status: DC | PRN

## 2017-04-01 MED ORDER — OXYTOCIN 40 UNITS IN LACTATED RINGERS INFUSION - SIMPLE MED
1.0000 m[IU]/min | INTRAVENOUS | Status: DC
  Administered 2017-04-01: 2 m[IU]/min via INTRAVENOUS
  Filled 2017-04-01: qty 1000

## 2017-04-01 MED ORDER — LACTATED RINGERS IV SOLN: 500.0000 mL | INTRAVENOUS | Status: DC | PRN

## 2017-04-01 MED ORDER — PHENYLEPHRINE 40 MCG/ML (10ML) SYRINGE FOR IV PUSH (FOR BLOOD PRESSURE SUPPORT)
80.0000 ug | PREFILLED_SYRINGE | INTRAVENOUS | Status: DC | PRN
  Filled 2017-04-01: qty 5
  Filled 2017-04-01: qty 10

## 2017-04-01 MED ORDER — LIDOCAINE HCL (PF) 1 % IJ SOLN
30.0000 mL | INTRAMUSCULAR | Status: DC | PRN
  Filled 2017-04-01: qty 30

## 2017-04-01 MED ORDER — DIPHENHYDRAMINE HCL 50 MG/ML IJ SOLN: 12.5000 mg | INTRAMUSCULAR | Status: DC | PRN

## 2017-04-01 MED ORDER — MISOPROSTOL 25 MCG QUARTER TABLET
25.0000 ug | ORAL_TABLET | ORAL | Status: DC
  Administered 2017-04-01 (×2): 25 ug via VAGINAL
  Filled 2017-04-01 (×6): qty 1

## 2017-04-01 MED ORDER — EPHEDRINE 5 MG/ML INJ
10.0000 mg | INTRAVENOUS | Status: DC | PRN
Start: 2017-04-01 — End: 2017-04-02
  Filled 2017-04-01: qty 2

## 2017-04-01 MED ORDER — ZOLPIDEM TARTRATE 5 MG PO TABS: 5.0000 mg | ORAL_TABLET | Freq: Every evening | ORAL | Status: DC | PRN

## 2017-04-01 MED ORDER — LACTATED RINGERS IV SOLN
INTRAVENOUS | Status: DC
  Administered 2017-04-01 (×3): via INTRAVENOUS

## 2017-04-01 MED ORDER — PHENYLEPHRINE 40 MCG/ML (10ML) SYRINGE FOR IV PUSH (FOR BLOOD PRESSURE SUPPORT)
80.0000 ug | PREFILLED_SYRINGE | INTRAVENOUS | Status: DC | PRN
Start: 2017-04-01 — End: 2017-04-02
  Filled 2017-04-01: qty 5

## 2017-04-01 MED ORDER — SOD CITRATE-CITRIC ACID 500-334 MG/5ML PO SOLN: 30.0000 mL | ORAL | Status: DC | PRN

## 2017-04-01 MED ORDER — LACTATED RINGERS IV SOLN
500.0000 mL | Freq: Once | INTRAVENOUS | Status: AC
  Administered 2017-04-01: 500 mL via INTRAVENOUS

## 2017-04-01 MED ORDER — DEXTROSE 5 % IV SOLN
5.0000 10*6.[IU] | Freq: Once | INTRAVENOUS | Status: AC
  Administered 2017-04-01: 5 10*6.[IU] via INTRAVENOUS
  Filled 2017-04-01: qty 5

## 2017-04-01 MED ORDER — PENICILLIN G POT IN DEXTROSE 60000 UNIT/ML IV SOLN
3.0000 10*6.[IU] | INTRAVENOUS | Status: DC
  Administered 2017-04-01 (×4): 3 10*6.[IU] via INTRAVENOUS
  Filled 2017-04-01 (×9): qty 50

## 2017-04-01 MED ORDER — OXYTOCIN BOLUS FROM INFUSION
500.0000 mL | Freq: Once | INTRAVENOUS | Status: AC
  Administered 2017-04-01: 500 mL via INTRAVENOUS

## 2017-04-01 MED ORDER — FENTANYL 2.5 MCG/ML BUPIVACAINE 1/10 % EPIDURAL INFUSION (WH - ANES)
14.0000 mL/h | INTRAMUSCULAR | Status: DC | PRN
  Administered 2017-04-01 (×2): 14 mL/h via EPIDURAL
  Filled 2017-04-01 (×2): qty 100

## 2017-04-01 MED ORDER — LIDOCAINE HCL (PF) 1 % IJ SOLN
INTRAMUSCULAR | Status: DC | PRN
Start: 2017-04-01 — End: 2017-04-01
  Administered 2017-04-01: 13 mL via EPIDURAL

## 2017-04-01 NOTE — Anesthesia Pain Management Evaluation Note (Signed)
  CRNA Pain Management Visit Note  Patient: Peggy Lester, 31 y.o., female  "Hello I am a member of the anesthesia team at [facility]. We have an anesthesia team available at all times to provide care throughout the hospital, including epidural management and anesthesia for C-section. I don't know your plan for the delivery whether it a natural birth, water birth, IV sedation, nitrous supplementation, doula or epidural, but we want to meet your pain goals."   1.Was your pain managed to your expectations on prior hospitalizations?   Yes   2.What is your expectation for pain management during this hospitalization?     Epidural  3.How can we help you reach that goal? epidural  Record the patient's initial score and the patient's pain goal.   Pain: 0  Pain Goal: 2 [facility] wants you to be able to say your pain was always managed very well.  Madison HickmanGREGORY,Kylan Liberati 04/01/2017

## 2017-04-01 NOTE — Progress Notes (Signed)
MD called to check pit dose and uterine activity. Informed MD of dose and activity; MD advised to keep increasing pitocin.

## 2017-04-01 NOTE — Anesthesia Procedure Notes (Signed)
Epidural Patient location during procedure: OB Start time: 04/01/2017 11:03 AM End time: 04/01/2017 11:24 AM  Staffing Anesthesiologist: Anitra LauthMILLER, Dajane Valli RAY Performed: anesthesiologist   Preanesthetic Checklist Completed: patient identified, site marked, surgical consent, pre-op evaluation, timeout performed, IV checked, risks and benefits discussed and monitors and equipment checked  Epidural Patient position: sitting Prep: DuraPrep Patient monitoring: heart rate, cardiac monitor, continuous pulse ox and blood pressure Approach: midline Location: L2-L3 Injection technique: LOR saline  Needle:  Needle type: Tuohy  Needle gauge: 17 G Needle length: 9 cm Needle insertion depth: 5 cm Catheter type: closed end flexible Catheter size: 20 Guage Catheter at skin depth: 9 cm Test dose: negative  Assessment Events: blood not aspirated, injection not painful, no injection resistance, negative IV test and no paresthesia  Additional Notes Reason for block:procedure for pain

## 2017-04-01 NOTE — Anesthesia Preprocedure Evaluation (Signed)
Anesthesia Evaluation  Patient identified by MRN, date of birth, ID band Patient awake    Reviewed: Allergy & Precautions, H&P , Patient's Chart, lab work & pertinent test results  Airway Mallampati: II  TM Distance: >3 FB Neck ROM: full    Dental  (+) Teeth Intact   Pulmonary    breath sounds clear to auscultation       Cardiovascular hypertension,  Rhythm:regular Rate:Normal     Neuro/Psych    GI/Hepatic   Endo/Other    Renal/GU      Musculoskeletal   Abdominal   Peds  Hematology   Anesthesia Other Findings       Reproductive/Obstetrics (+) Pregnancy                             Anesthesia Physical  Anesthesia Plan  ASA: II  Anesthesia Plan: Epidural   Post-op Pain Management:    Induction:   PONV Risk Score and Plan:   Airway Management Planned:   Additional Equipment:   Intra-op Plan:   Post-operative Plan:   Informed Consent: I have reviewed the patients History and Physical, chart, labs and discussed the procedure including the risks, benefits and alternatives for the proposed anesthesia with the patient or authorized representative who has indicated his/her understanding and acceptance.     Dental Advisory Given  Plan Discussed with:   Anesthesia Plan Comments: (Labs checked- platelets confirmed with RN in room. Fetal heart tracing, per RN, reported to be stable enough for sitting procedure. Discussed epidural, and patient consents to the procedure:  included risk of possible headache,backache, failed block, allergic reaction, and nerve injury. This patient was asked if she had any questions or concerns before the procedure started.)        Anesthesia Quick Evaluation  

## 2017-04-01 NOTE — H&P (Signed)
Pt is a 31 y/o white female G1P0 at 37 wks who was admitted for an induction for HTN and low AFI. She has been followed by MFM.  PMHX: See hollister She had a nl NIPT and OGTT. She had multiple utis throughout the preg.  PE: HEENT-wnl        ABD- gravid, no palp ctxs.         Cx-20/1/-2 vtx, AROM -clear.  IMP/ IUP at37 wks with HTN and low AFI          Plan/Admit and induce

## 2017-04-02 DIAGNOSIS — Z3490 Encounter for supervision of normal pregnancy, unspecified, unspecified trimester: Secondary | ICD-10-CM

## 2017-04-02 MED ORDER — SIMETHICONE 80 MG PO CHEW: 80.0000 mg | CHEWABLE_TABLET | ORAL | Status: DC | PRN

## 2017-04-02 MED ORDER — COCONUT OIL OIL: 1.0000 "application " | TOPICAL_OIL | Status: DC | PRN

## 2017-04-02 MED ORDER — SENNOSIDES-DOCUSATE SODIUM 8.6-50 MG PO TABS
2.0000 | ORAL_TABLET | ORAL | Status: DC
  Administered 2017-04-02: 2 via ORAL
  Filled 2017-04-02: qty 2

## 2017-04-02 MED ORDER — OXYCODONE-ACETAMINOPHEN 5-325 MG PO TABS: 1.0000 | ORAL_TABLET | ORAL | Status: DC | PRN

## 2017-04-02 MED ORDER — IBUPROFEN 600 MG PO TABS
600.0000 mg | ORAL_TABLET | Freq: Four times a day (QID) | ORAL | Status: DC
  Administered 2017-04-02 – 2017-04-03 (×5): 600 mg via ORAL
  Filled 2017-04-02 (×5): qty 1

## 2017-04-02 MED ORDER — MEASLES, MUMPS & RUBELLA VAC ~~LOC~~ INJ
0.5000 mL | INJECTION | Freq: Once | SUBCUTANEOUS | Status: DC
  Filled 2017-04-02: qty 0.5

## 2017-04-02 MED ORDER — ZOLPIDEM TARTRATE 5 MG PO TABS: 5.0000 mg | ORAL_TABLET | Freq: Every evening | ORAL | Status: DC | PRN

## 2017-04-02 MED ORDER — ACETAMINOPHEN 325 MG PO TABS: 650.0000 mg | ORAL_TABLET | ORAL | Status: DC | PRN

## 2017-04-02 MED ORDER — ONDANSETRON HCL 4 MG PO TABS: 4.0000 mg | ORAL_TABLET | ORAL | Status: DC | PRN

## 2017-04-02 MED ORDER — WITCH HAZEL-GLYCERIN EX PADS: 1.0000 "application " | MEDICATED_PAD | CUTANEOUS | Status: DC | PRN

## 2017-04-02 MED ORDER — TETANUS-DIPHTH-ACELL PERTUSSIS 5-2.5-18.5 LF-MCG/0.5 IM SUSP: 0.5000 mL | Freq: Once | INTRAMUSCULAR | Status: DC

## 2017-04-02 MED ORDER — DIBUCAINE 1 % RE OINT: 1.0000 "application " | TOPICAL_OINTMENT | RECTAL | Status: DC | PRN

## 2017-04-02 MED ORDER — BENZOCAINE-MENTHOL 20-0.5 % EX AERO
1.0000 "application " | INHALATION_SPRAY | CUTANEOUS | Status: DC | PRN
  Administered 2017-04-02: 1 via TOPICAL
  Filled 2017-04-02: qty 56

## 2017-04-02 MED ORDER — ONDANSETRON HCL 4 MG/2ML IJ SOLN: 4.0000 mg | INTRAMUSCULAR | Status: DC | PRN

## 2017-04-02 MED ORDER — OXYCODONE-ACETAMINOPHEN 5-325 MG PO TABS: 2.0000 | ORAL_TABLET | ORAL | Status: DC | PRN

## 2017-04-02 NOTE — Lactation Note (Signed)
This note was copied from a baby's chart. Lactation Consultation Note Baby 37 weeks. 5.4 lbs. Mom BF her almost 444 yr old for 6 months w/o difficulty. Mom has everted nipples. Hand expressed 8 ml colostrum. Mom stated she started leaking this past week.  LPI information sheet given d/t baby wt. And need of supplementation after BF. Encouraged to give colostrum as supplementation first before giving formula. Discussed feeding options of spoon feeding for small amounts then syring feeding for larger amounts more than a teaspoon. Educated on LPI behavior, feeding patterns, I&O, cluster feeding, supply and demand.  Mom shown how to use DEBP & how to disassemble, clean, & reassemble parts. Mom knows to pump q3h for 15-20 min.  Mom encouraged to feed baby 8-12 times/24 hours and with feeding cues.  WH/LC brochure given w/resources, support groups and LC services. Patient Name: Peggy Lester Reason for consult: Initial assessment   Maternal Data Has patient been taught Hand Expression?: Yes Does the patient have breastfeeding experience prior to this delivery?: Yes  Feeding Feeding Type: Breast Fed  LATCH Score/Interventions Latch: Repeated attempts needed to sustain latch, nipple held in mouth throughout feeding, stimulation needed to elicit sucking reflex. Intervention(s): Breast massage;Breast compression;Adjust position  Audible Swallowing: A few with stimulation Intervention(s): Hand expression;Skin to skin;Alternate breast massage  Type of Nipple: Everted at rest and after stimulation  Comfort (Breast/Nipple): Soft / non-tender     Hold (Positioning): Assistance needed to correctly position infant at breast and maintain latch. Intervention(s): Breastfeeding basics reviewed;Support Pillows;Position options;Skin to skin  LATCH Score: 7  Lactation Tools Discussed/Used Tools: Pump Breast pump type: Double-Electric Breast Pump Pump Review: Setup, frequency,  and cleaning;Milk Storage Initiated by:: Peri JeffersonL. Bronson Bressman RN IBCLC Date initiated:: 04/02/17   Consult Status Consult Status: Follow-up Date: 04/02/17 Follow-up type: In-patient    Charyl DancerCARVER, Peggy Lester Lester, 2:47 AM

## 2017-04-02 NOTE — Lactation Note (Signed)
This note was copied from a baby's chart. Lactation Consultation Note  Patient Name: Peggy Lester [id number]'s Date: 04/02/2017 Reason for consult: Follow-up assessment (per mom recently attempted/  LC enc mom to pump/ call LC) 1st visit 14 plus hours old and per mom last fed at 1000 for 30 mins with swallows.  LC encouraged mom to call with feeding cues.  2nd LC visit - per mom recently attempted baby sleepy.  LC recommended and offered mom assist to place the baby STS , or to post  pump with the DEBP  ( pump has already been set up and per mom familiar with how to use it )  LC encouraged to call with feeding cues.    Maternal Data    Feeding Feeding Type: Breast Fed Length of feed: 30 min (per mom )  LATCH Score/Interventions                Intervention(s): Breastfeeding basics reviewed     Lactation Tools Discussed/Used Tools: Pump (LC encouraged mom to pump ) Breast pump type: Double-Electric Breast Pump   Consult Status Consult Status: Follow-up Date: 04/02/17 Follow-up type: In-patient    [name] 04/02/2017, 1:55 PM

## 2017-04-02 NOTE — Anesthesia Postprocedure Evaluation (Signed)
Anesthesia Post Note  Patient: Manfred ArchSara T Hunn  Procedure(s) Performed: * No procedures listed *     Patient location during evaluation: Mother Baby Anesthesia Type: Epidural Level of consciousness: awake and alert Pain management: pain level controlled Vital Signs Assessment: post-procedure vital signs reviewed and stable Respiratory status: spontaneous breathing Cardiovascular status: blood pressure returned to baseline Postop Assessment: no headache, no backache, epidural receding, adequate PO intake, no signs of nausea or vomiting and patient able to bend at knees Anesthetic complications: no    Last Vitals:  Vitals:   04/02/17 0010 04/02/17 0410  BP: (!) 154/91 125/76  Pulse: 94 88  Resp: 20 18  Temp: 36.6 C 36.9 C    Last Pain:  Vitals:   04/02/17 0710  TempSrc:   PainSc: Asleep   Pain Goal:                 [facility] HospitalMARSHALL,Lahoma Constantin

## 2017-04-02 NOTE — Progress Notes (Signed)
Patient is doing well.  She is ambulating, voiding, tolerating PO.  Pain control is good.  Lochia is appropriate  BPs prior to delivery 140-150s/80-90s.  Most recent 125/76.  Asymptomatic.    Vitals:   04/01/17 2230 04/01/17 2310 04/02/17 0010 04/02/17 0410  BP: (!) 144/83 (!) 143/93 (!) 154/91 125/76  Pulse: (!) 109 (!) 101 94 88  Resp: 16 18 20 18   Temp: 98.9 F (37.2 C) 98.9 F (37.2 C) 97.9 F (36.6 C) 98.4 F (36.9 C)  TempSrc: Oral Oral Oral Oral  SpO2:      Weight:      Height:        NAD Fundus firm Ext: trace edema b/l  Lab Results  Component Value Date   WBC 18.6 (H) 04/01/2017   HGB 10.3 (L) 04/01/2017   HCT 30.1 (L) 04/01/2017   MCV 85.0 04/01/2017   PLT 197 04/01/2017    --/--/B POS, B POS (06/26 0117)/RI  A/P 31 y.o. G2P2002 PPD#1. Routine care.   HTN--mild range BPs during delivery.  Most recent wnl.  Will monitor BPs for next 24hrs, likely d/c in AM   [facility] HealthcareDYANNA GEFFEL BiolaLARK

## 2017-04-03 NOTE — Discharge Summary (Signed)
Obstetric Discharge Summary Reason for Admission: induction of labor and oligo and hypetension Prenatal Procedures: NST and ultrasound Intrapartum Procedures: spontaneous vaginal delivery Postpartum Procedures: none Complications-Operative and Postpartum: none Hemoglobin  Date Value Ref Range Status  04/01/2017 10.3 (L) 12.0 - 15.0 g/dL Final   HCT  Date Value Ref Range Status  04/01/2017 30.1 (L) 36.0 - 46.0 % Final    Discharge Diagnoses: Term Pregnancy-delivered  Discharge Information: Date: 04/03/2017 Activity: pelvic rest Diet: routine Medications: Ibuprofen Condition: stable Instructions: refer to practice specific booklet Discharge to: home Follow-up Information    [name], [name], MD Follow up in 1 week(s).   Specialty:  Obstetrics and Gynecology Why:  BP check Contact information: [address] [place] [phone]           Newborn Data: Live born female  Birth Weight: 5 lb 4.7 oz (2401 g) APGAR: 9, 9  Home with mother.  [name] A 04/03/2017, 8:18 AM

## 2017-04-03 NOTE — Lactation Note (Signed)
This note was copied from a baby's chart. Lactation Consultation Note  Patient Name: Peggy Lester ZOXWR'UToday's Date: 04/03/2017  Mom is feeling good about feedings.  She states baby cluster fed last night.  Mom states she feels like milk is coming in.  No questions or concerns at present.  Reviewed lactation outpatient services and support and encouraged to call prn.   Maternal Data    Feeding Feeding Type: Breast Fed Length of feed: 20 min  LATCH Score/Interventions Latch: Grasps breast easily, tongue down, lips flanged, rhythmical sucking.  Audible Swallowing: A few with stimulation  Type of Nipple: Everted at rest and after stimulation  Comfort (Breast/Nipple): Soft / non-tender     Hold (Positioning): No assistance needed to correctly position infant at breast.  LATCH Score: 9  Lactation Tools Discussed/Used     Consult Status      Huston FoleyMOULDEN, Mykhia Danish S 04/03/2017, 9:22 AM

## 2017-04-03 NOTE — Progress Notes (Signed)
Patient is eating, ambulating, voiding.  Pain control is good.  Vitals:   04/02/17 0410 04/02/17 0917 04/02/17 1855 04/03/17 0500  BP: 125/76 (!) 129/93 135/88 126/87  Pulse: 88 79 83 88  Resp: 18 18 18    Temp: 98.4 F (36.9 C) 98 F (36.7 C) 98.1 F (36.7 C) 98.3 F (36.8 C)  TempSrc: Oral Oral Oral   SpO2:      Weight:      Height:        Fundus firm Perineum without swelling.  Lab Results  Component Value Date   WBC 18.6 (H) 04/01/2017   HGB 10.3 (L) 04/01/2017   HCT 30.1 (L) 04/01/2017   MCV 85.0 04/01/2017   PLT 197 04/01/2017    --/--/B POS, B POS (06/26 0117)/RI  A/P Post partum day 2.  BPs stable today- ok for d/c with early f/u  Routine care.  Expect d/c today.    Nevin Kozuch A

## 2018-09-16 IMAGING — US US MFM OB FOLLOW-UP
1 series · 14 of 28 positions shown · non-contrast
Comparison: none

[Series 1: us mfm ob follow-up · 84 acquisitions, 14 frames shown]
[im 4/84]
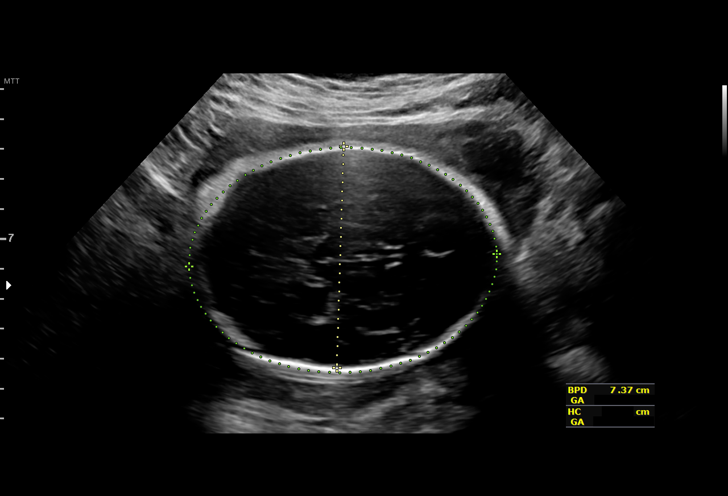
[im 10/84]
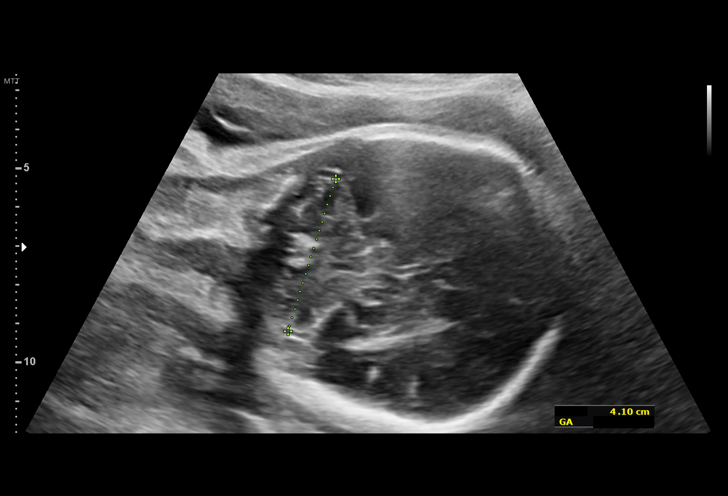
[im 16/84]
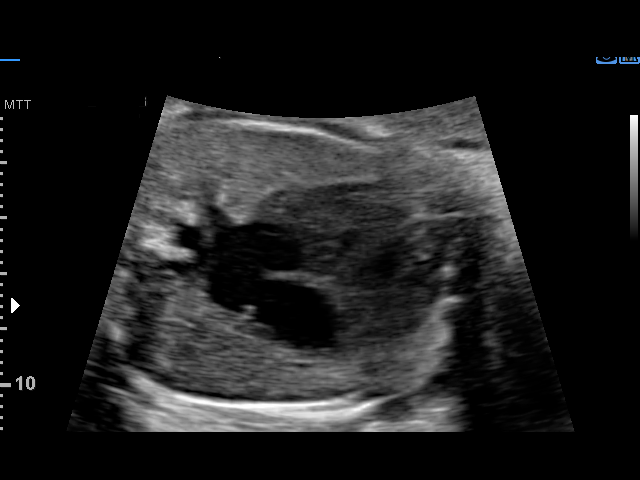
[im 22/84]
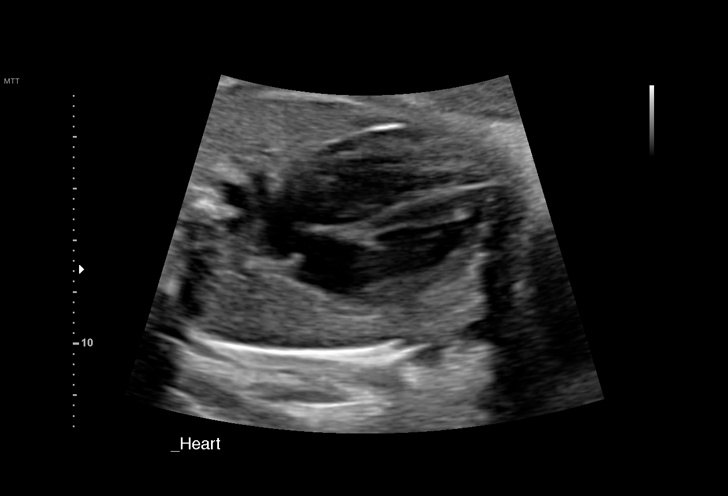
[im 28/84]
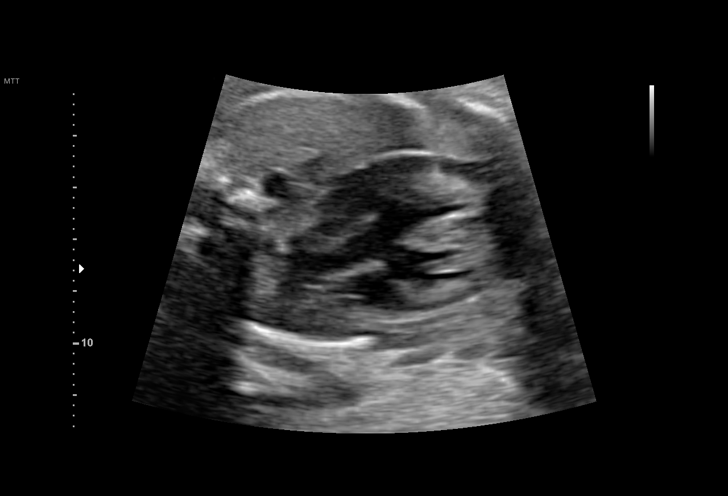
[im 34/84]
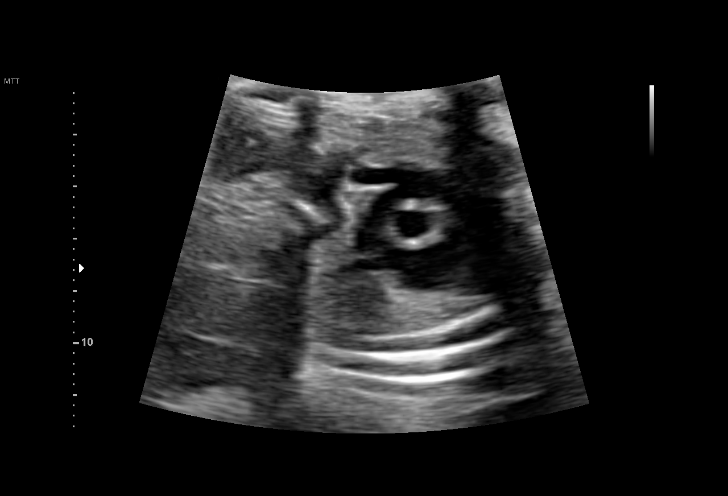
[im 40/84]
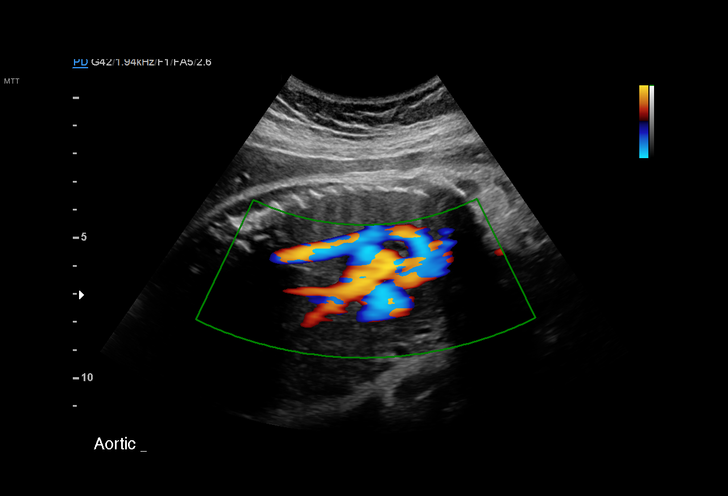
[im 47/84]
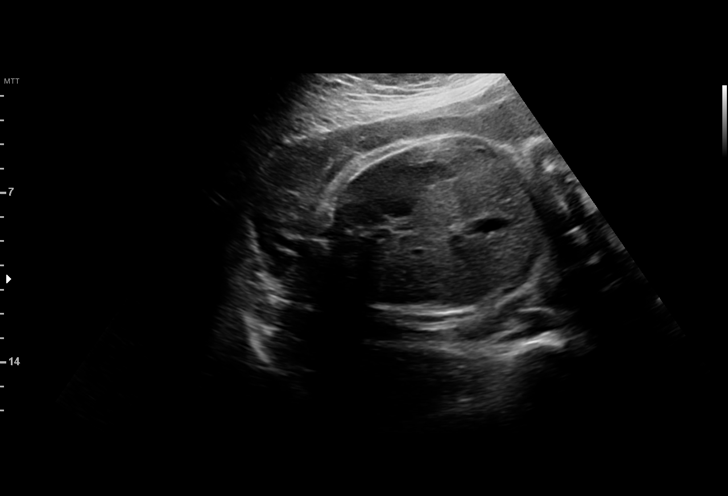
[im 53/84]
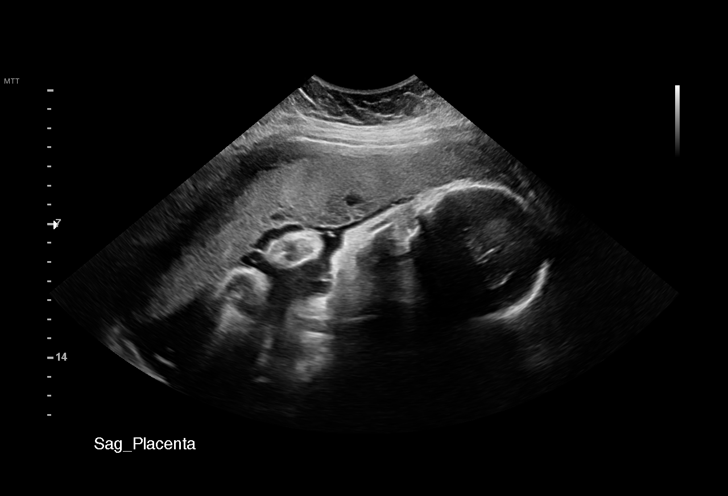
[im 59/84]
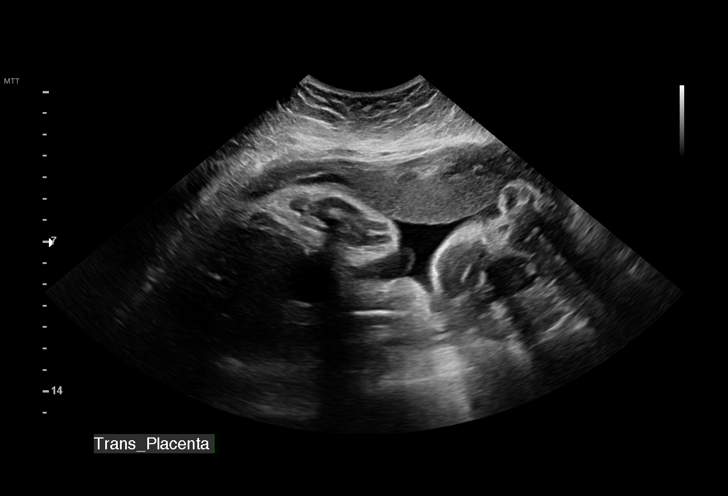
[im 65/84]
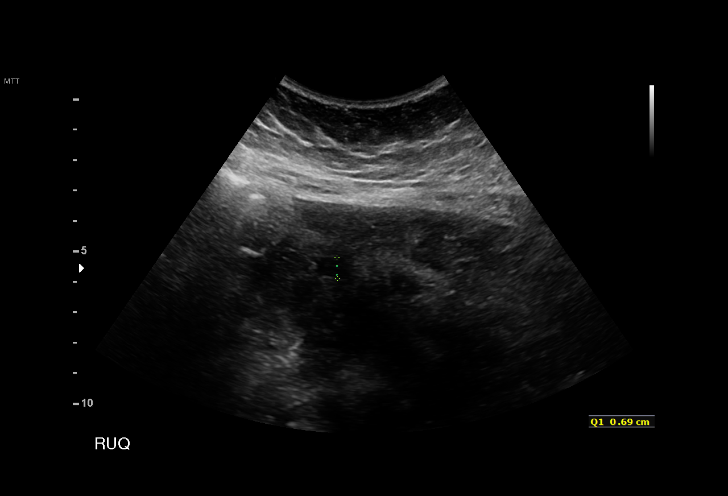
[im 71/84]
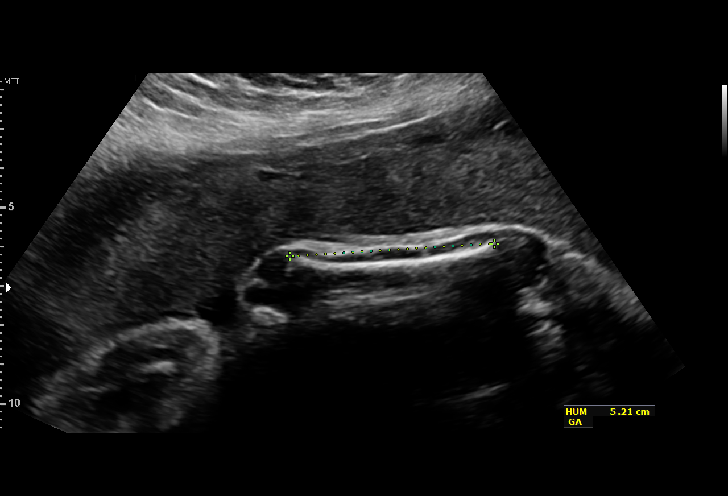
[im 77/84]
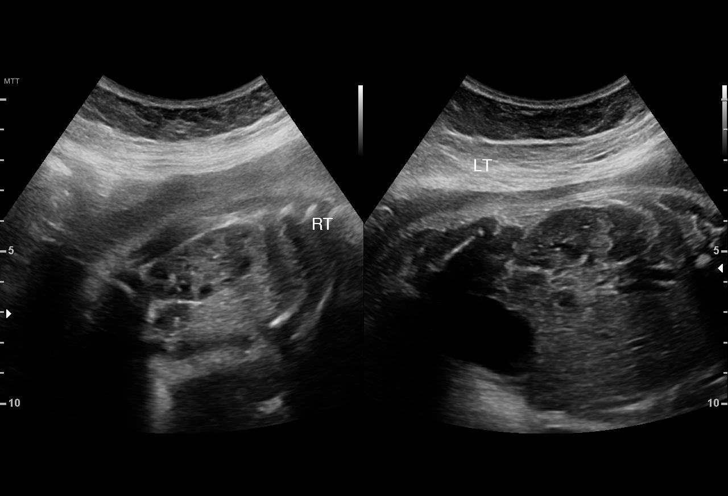
[im 84/84]
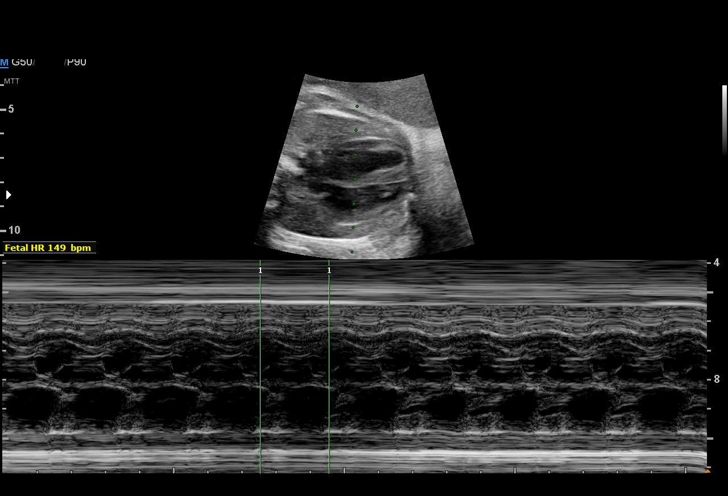

[14 of 28 positions shown; findings below may reference images not displayed]

ste632

1  AWEI FEE            [id number]       [id number]     [id number]
Indications

32 weeks gestation of pregnancy
Hypertension - Chronic/Pre-existing
(procardia)
Encounter for other antenatal screening
follow-up
OB History

Gravidity:    2         Term:   1
Living:       1
Fetal Evaluation

Num Of Fetuses:     1
Fetal Heart         149
Rate(bpm):
Cardiac Activity:   Observed
Presentation:       Cephalic
Placenta:           Anterior, above cervical os
P. Cord Insertion:  Previously Visualized

Amniotic Fluid
AFI FV:      Subjectively low-normal

AFI Sum(cm)     %Tile       Largest Pocket(cm)
7.6             3

RUQ(cm)       RLQ(cm)       LUQ(cm)        LLQ(cm)
0.69
Biometry

BPD:      73.7  mm     G. Age:  29w 4d          1  %    CI:        69.88   %   70 - 86
FL/HC:      21.2   %   19.1 -
HC:      281.3  mm     G. Age:  30w 6d        < 3  %    HC/AC:      1.00       0.96 -
AC:      280.6  mm     G. Age:  32w 0d         40  %    FL/BPD:     80.7   %   71 - 87
FL:       59.5  mm     G. Age:  31w 0d          9  %    FL/AC:      21.2   %   20 - 24
HUM:      52.1  mm     G. Age:  30w 3d         13  %
CER:        41  mm     G. Age:  35w 0d         81  %

Est. FW:    1674  gm    3 lb 14 oz      37  %
Gestational Age

LMP:           32w 3d       Date:   07/16/16                 EDD:   04/22/17
U/S Today:     30w 6d                                        EDD:   05/03/17
Best:          32w 3d    Det. By:   LMP  (07/16/16)          EDD:   04/22/17
Anatomy

Cranium:               Appears normal         Aortic Arch:            Previously seen
Cavum:                 Appears normal         Ductal Arch:            Previously seen
Ventricles:            Appears normal         Diaphragm:              Appears normal
Choroid Plexus:        Previously seen        Stomach:                Appears normal, left
sided
Cerebellum:            Appears normal         Abdomen:                Previously seen
Posterior Fossa:       Previously seen        Abdominal Wall:         Previously seen
Nuchal Fold:           Not applicable (>20    Cord Vessels:           Previously seen
wks GA)
Face:                  Not well visualized    Kidneys:                Appear normal
Lips:                  Previously seen        Bladder:                Appears normal
Thoracic:              Appears normal         Spine:                  Appears normal
Heart:                 Appears normal         Upper Extremities:      Previously seen
(4CH, axis, and situs
RVOT:                  Appears normal         Lower Extremities:      Previously seen
LVOT:                  Appears normal

Other:  Female gender previously seen. Heels previously seen.  Technically
difficult due to advanced GA and fetal position.
Cervix Uterus Adnexa

Cervix
Not visualized (advanced GA >77wks)

Uterus
No abnormality visualized.

Left Ovary
Size(cm)       2.2 x    1.96   x  1.61      Vol(ml):
Within normal limits. No adnexal mass visualized.

Right Ovary
Size(cm)       1.8 x    2.89   x  1.81      Vol(ml):
Within normal limits. No adnexal mass visualized.

Cul De Sac:   No free fluid seen.

Adnexa:       No abnormality visualized.
Impression

Single IUP at 32+3 weeks with chronic hypertension and
borderline fluid
Interval review of the fetal anatomy appears normal.
The overall estimated fetal weight is at the 37th percentile.
The AC measures at the 40th percentile.
The HC remains at  <3rd percentile but well above 2SD below
the mean for gestational age (cut-off used for microcephaly)
Anterior placenta without previa
Borderline amniotic fluid volume with an AFI of 7.6 cm
Recommendations

Repeat growth scan in 4 weeks
continue twice weekly NST and weekly AFI in your office

## 2020-09-16 ENCOUNTER — Encounter (HOSPITAL_COMMUNITY): Payer: Self-pay | Admitting: *Deleted

## 2020-09-16 ENCOUNTER — Emergency Department (HOSPITAL_COMMUNITY)
Admission: EM | Admit: 2020-09-16 | Discharge: 2020-09-17 | Disposition: A | Discharge status: Home / Self Care | Payer: BC Managed Care – PPO | Attending: Emergency Medicine | Admitting: Emergency Medicine

## 2020-09-16 ENCOUNTER — Other Ambulatory Visit: Payer: Self-pay

## 2020-09-16 DIAGNOSIS — I160 Hypertensive urgency: Secondary | ICD-10-CM

## 2020-09-16 DIAGNOSIS — I10 Essential (primary) hypertension: Secondary | ICD-10-CM | POA: Diagnosis not present

## 2020-09-16 LAB — CBC WITH DIFFERENTIAL/PLATELET
Abs Immature Granulocytes: 0.03 10*3/uL (ref 0.00–0.07)
Basophils Absolute: 0 10*3/uL (ref 0.0–0.1)
Basophils Relative: 0 %
Eosinophils Absolute: 0.1 10*3/uL (ref 0.0–0.5)
Eosinophils Relative: 1 %
HCT: 38 % (ref 36.0–46.0)
Hemoglobin: 12.6 g/dL (ref 12.0–15.0)
Immature Granulocytes: 0 %
Lymphocytes Relative: 25 %
Lymphs Abs: 3.4 10*3/uL (ref 0.7–4.0)
MCH: 27.8 pg (ref 26.0–34.0)
MCHC: 33.2 g/dL (ref 30.0–36.0)
MCV: 83.9 fL (ref 80.0–100.0)
Monocytes Absolute: 0.8 10*3/uL (ref 0.1–1.0)
Monocytes Relative: 6 %
Neutro Abs: 9.4 10*3/uL — ABNORMAL HIGH (ref 1.7–7.7)
Neutrophils Relative %: 68 %
Platelets: 264 10*3/uL (ref 150–400)
RBC: 4.53 MIL/uL (ref 3.87–5.11)
RDW: 13.2 % (ref 11.5–15.5)
WBC: 13.7 10*3/uL — ABNORMAL HIGH (ref 4.0–10.5)
nRBC: 0 % (ref 0.0–0.2)

## 2020-09-16 LAB — BASIC METABOLIC PANEL
Anion gap: 7 (ref 5–15)
BUN: 11 mg/dL (ref 6–20)
CO2: 23 mmol/L (ref 22–32)
Calcium: 8.7 mg/dL — ABNORMAL LOW (ref 8.9–10.3)
Chloride: 104 mmol/L (ref 98–111)
Creatinine, Ser: 0.82 mg/dL (ref 0.44–1.00)
GFR, Estimated: >60 mL/min (ref >60)
Glucose, Bld: 95 mg/dL (ref 70–99)
Potassium: 3.1 mmol/L — ABNORMAL LOW (ref 3.5–5.1)
Sodium: 134 mmol/L — ABNORMAL LOW (ref 135–145)

## 2020-09-16 LAB — HCG, SERUM, QUALITATIVE: Preg, Serum: NEGATIVE

## 2020-09-16 MED ORDER — CLONIDINE HCL 0.2 MG PO TABS
0.2000 mg | ORAL_TABLET | Freq: Once | ORAL | Status: AC
  Administered 2020-09-16: 23:00:00 0.2 mg via ORAL
  Filled 2020-09-16: qty 1

## 2020-09-16 NOTE — ED Provider Notes (Signed)
[facility] EMERGENCY DEPARTMENT Provider Note   CSN: [id number] Arrival date & time: 09/16/20  1913     History Chief Complaint  Patient presents with  . Hypertension    Peggy Lester is a 34 y.o. female.  Patient is a 34 year old female with history of pregnancy-induced hypertension.  She presents today for evaluation of elevated blood pressure.  Her father has been at the hospital receiving treatments upstairs.  While she was here, she checked her blood pressure and it was elevated over 200 systolic.  This concerned her so she presents for evaluation.  She denies any headache, chest pain, difficulty breathing, or other complaints.  The history is provided by the patient.  Hypertension This is a new problem. The problem occurs constantly. The problem has not changed since onset.Pertinent negatives include no chest pain, no headaches and no shortness of breath. Nothing aggravates the symptoms. Nothing relieves the symptoms. She has tried nothing for the symptoms.       Past Medical History:  Diagnosis Date  . Hx of varicella   . Hypertension   . Non-stress test reactive on fetal surveillance 03/11/2017  . Oligohydramnios 03/11/2017  . Pregnancy induced hypertension   . UTI (urinary tract infection)     Patient Active Problem List   Diagnosis Date Noted  . Pregnancy 04/02/2017  . HTN (hypertension) 04/01/2017  . Oligohydramnios 03/11/2017  . Non-stress test reactive on fetal surveillance 03/11/2017    Past Surgical History:  Procedure Laterality Date  . MOUTH SURGERY    . WISDOM TOOTH EXTRACTION       OB History    Gravida  2   Para  2   Term  2   Preterm      AB      Living  2     SAB      IAB      Ectopic      Multiple  0   Live Births  2           Family History  Problem Relation Age of Onset  . Hypertension Mother   . Diabetes Maternal Grandmother   . Down syndrome Cousin     Social History   Tobacco Use  . Smoking status: Never  Smoker  . Smokeless tobacco: Never Used  Substance Use Topics  . Alcohol use: No  . Drug use: No    Home Medications Prior to Admission medications   Medication Sig Start Date End Date Taking? Authorizing Provider  calcium carbonate (TUMS - DOSED IN MG ELEMENTAL CALCIUM) 500 MG chewable tablet Chew 1-2 tablets by mouth daily.    [provider]  nitrofurantoin, macrocrystal-monohydrate, (MACROBID) 100 MG capsule Take 100 mg by mouth at bedtime.    [provider]  Prenatal Vit-Fe Fumarate-FA (PRENATAL MULTIVITAMIN) TABS Take 1 tablet by mouth daily at 12 noon.    [provider]    Allergies    Minocycline  Review of Systems   Review of Systems  Respiratory: Negative for shortness of breath.   Cardiovascular: Negative for chest pain.  Neurological: Negative for headaches.  All other systems reviewed and are negative.   Physical Exam Updated Vital Signs BP (!) 199/115   Pulse 90   Temp 98.6 F (37 C) (Oral)   Resp 18   Ht 5\' 7"  (1.702 m)   Wt 83 kg   LMP 08/21/2020   SpO2 100%   BMI 28.66 kg/m   Physical Exam Vitals and  nursing note reviewed.  Constitutional:      General: She is not in acute distress.    Appearance: She is well-developed and well-nourished. She is not diaphoretic.  HENT:     Head: Normocephalic and atraumatic.  Eyes:     Extraocular Movements: Extraocular movements intact.     Pupils: Pupils are equal, round, and reactive to light.  Cardiovascular:     Rate and Rhythm: Normal rate and regular rhythm.     Heart sounds: No murmur heard. No friction rub. No gallop.   Pulmonary:     Effort: Pulmonary effort is normal. No respiratory distress.     Breath sounds: Normal breath sounds. No wheezing.  Abdominal:     General: Bowel sounds are normal. There is no distension.     Palpations: Abdomen is soft.     Tenderness: There is no abdominal tenderness.  Musculoskeletal:        General: Normal range of motion.      Cervical back: Normal range of motion and neck supple.  Skin:    General: Skin is warm and dry.  Neurological:     General: No focal deficit present.     Mental Status: She is alert and oriented to person, place, and time.     Cranial Nerves: No cranial nerve deficit.     Motor: No weakness.     Coordination: Coordination normal.     ED Results / Procedures / Treatments   Labs (all labs ordered are listed, but only abnormal results are displayed) Labs Reviewed  CBC WITH DIFFERENTIAL/PLATELET  BASIC METABOLIC PANEL  HCG, SERUM, QUALITATIVE    EKG EKG Interpretation  Date/Time:  Saturday September 16 2020 23:32:07 EST Ventricular Rate:  80 PR Interval:    QRS Duration: 81 QT Interval:  358 QTC Calculation: 413 R Axis:   44 Text Interpretation: Sinus rhythm Probable left atrial enlargement Abnormal R-wave progression, early transition Confirmed by Geoffery Lyons ([id number]) on 09/16/2020 11:37:59 PM   Radiology No results found.  Procedures Procedures (including critical care time)  Medications Ordered in ED Medications  cloNIDine (CATAPRES) tablet 0.2 mg (has no administration in time range)    ED Course  I have reviewed the triage vital signs and the nursing notes.  Pertinent labs & imaging results that were available during my care of the patient were reviewed by me and considered in my medical decision making (see chart for details).    MDM Rules/Calculators/A&P  Patient presenting with complaints of elevated blood pressure.  This has been in excess of 200 on occasion over the past few days.  She has no other symptoms.  Patient's vitals are stable.  Her blood pressure was significantly elevated upon presentation, but neurologic exam and remainder of physical examination unremarkable.  Patient was given 0.2 mg of clonidine and upon reassessment her blood pressure was 126/89.  Laboratory studies are unremarkable and show no sign of endorgan damage.  At this point, I  feel as though discharge is appropriate.  I will start her on a low-dose of amlodipine and have her keep a record of her blood pressures over the next few days.  She is to follow-up with her primary doctor in the next week for a recheck.  Final Clinical Impression(s) / ED Diagnoses Final diagnoses:  None    Rx / DC Orders ED Discharge Orders    None       Geoffery Lyons, MD 09/17/20 0110

## 2020-09-16 NOTE — ED Triage Notes (Signed)
Pt with HTN noted at home.  187/112 Wednesday,  180's and has been continued to be high with home bp machine

## 2020-09-17 MED ORDER — AMLODIPINE BESYLATE 5 MG PO TABS: 5.0000 mg | ORAL_TABLET | Freq: Every day | ORAL | 0 refills | Status: DC

## 2020-09-17 NOTE — Discharge Instructions (Addendum)
Begin taking amlodipine as prescribed this evening.  Keep a record of your blood pressures over the next week, then follow-up with your primary doctor to discuss the results.  Return to the ER in the meantime if you develop chest pain, difficulty breathing, severe headache, or other new and concerning symptoms.

## 2020-09-27 DIAGNOSIS — Z0000 Encounter for general adult medical examination without abnormal findings: Secondary | ICD-10-CM | POA: Diagnosis not present

## 2020-09-27 DIAGNOSIS — I10 Essential (primary) hypertension: Secondary | ICD-10-CM | POA: Diagnosis not present

## 2020-09-27 DIAGNOSIS — E663 Overweight: Secondary | ICD-10-CM | POA: Diagnosis not present

## 2020-09-27 DIAGNOSIS — Z1331 Encounter for screening for depression: Secondary | ICD-10-CM | POA: Diagnosis not present

## 2020-09-27 DIAGNOSIS — Z1389 Encounter for screening for other disorder: Secondary | ICD-10-CM | POA: Diagnosis not present

## 2020-09-27 DIAGNOSIS — Z6828 Body mass index (BMI) 28.0-28.9, adult: Secondary | ICD-10-CM | POA: Diagnosis not present

## 2020-09-27 DIAGNOSIS — Z0001 Encounter for general adult medical examination with abnormal findings: Secondary | ICD-10-CM | POA: Diagnosis not present

## 2021-03-26 DIAGNOSIS — E663 Overweight: Secondary | ICD-10-CM | POA: Diagnosis not present

## 2021-03-26 DIAGNOSIS — I10 Essential (primary) hypertension: Secondary | ICD-10-CM | POA: Diagnosis not present

## 2021-03-26 DIAGNOSIS — Z6829 Body mass index (BMI) 29.0-29.9, adult: Secondary | ICD-10-CM | POA: Diagnosis not present

## 2021-08-02 DIAGNOSIS — Z23 Encounter for immunization: Secondary | ICD-10-CM | POA: Diagnosis not present

## 2023-08-06 ENCOUNTER — Ambulatory Visit: Payer: 59 | Admitting: Advanced Practice Midwife

## 2023-08-13 ENCOUNTER — Other Ambulatory Visit (HOSPITAL_COMMUNITY)
Admission: RE | Admit: 2023-08-13 | Discharge: 2023-08-13 | Disposition: A | Discharge status: Home / Self Care | Payer: 59 | Source: Ambulatory Visit | Attending: Advanced Practice Midwife | Admitting: Advanced Practice Midwife

## 2023-08-13 ENCOUNTER — Encounter: Payer: Self-pay | Admitting: Advanced Practice Midwife

## 2023-08-13 ENCOUNTER — Ambulatory Visit (INDEPENDENT_AMBULATORY_CARE_PROVIDER_SITE_OTHER): Payer: 59 | Admitting: Advanced Practice Midwife

## 2023-08-13 VITALS — BP 132/73 | HR 121 | Ht 67.0 in | Wt 207.0 lb

## 2023-08-13 DIAGNOSIS — Z01419 Encounter for gynecological examination (general) (routine) without abnormal findings: Secondary | ICD-10-CM

## 2023-08-13 NOTE — Addendum Note (Signed)
Addended by: Moss Mc on: 08/13/2023 04:12 PM   Modules accepted: Orders

## 2023-08-13 NOTE — Progress Notes (Signed)
WELL-WOMAN EXAMINATION Patient name: Peggy Lester MRN [id number]  Date of birth: 1985/11/27 Chief Complaint:   Annual Exam  History of Present Illness:   Peggy Lester is a 37 y.o. G43P2002 Caucasian female being seen today for a routine well-woman exam.  Current complaints: doing well; mostly reg cycles  PCP: Dr Phillips Odor      does not desire labs Patient's last menstrual period was 08/10/2023. The current method of family planning is condoms.  Last pap ~66yrs ago. Results were: negative per pt report at previous provider . H/O abnormal pap: no Last mammogram: never. Results were: N/A. Family h/o breast cancer: no Last colonoscopy: never. Results were: N/A. Family h/o colorectal cancer: no     08/13/2023   10:10 AM  Depression screen PHQ 2/9  Decreased Interest 0  Down, Depressed, Hopeless 0  PHQ - 2 Score 0  Altered sleeping 0  Tired, decreased energy 0  Change in appetite 0  Feeling bad or failure about yourself  0  Trouble concentrating 0  Moving slowly or fidgety/restless 0  Suicidal thoughts 0  PHQ-9 Score 0        08/13/2023   10:11 AM  GAD 7 : Generalized Anxiety Score  Nervous, Anxious, on Edge 0  Control/stop worrying 0  Worry too much - different things 0  Trouble relaxing 0  Restless 0  Easily annoyed or irritable 0  Afraid - awful might happen 0  Total GAD 7 Score 0     Review of Systems:   Pertinent items are noted in HPI Denies any headaches, blurred vision, fatigue, shortness of breath, chest pain, abdominal pain, abnormal vaginal discharge/itching/odor/irritation, problems with periods, bowel movements, urination, or intercourse unless otherwise stated above. Pertinent History Reviewed:  Reviewed past medical,surgical, social and family history.  Reviewed problem list, medications and allergies. Physical Assessment:   Vitals:   08/13/23 1029  BP: 132/73  Pulse: (!) 121  Weight: 207 lb (93.9 kg)  Height: 5\' 7"  (1.702 m)  Body mass index is 32.42  kg/m.        Physical Examination:   General appearance - well appearing, and in no distress  Mental status - alert, oriented to person, place, and time  Psych:  She has a normal mood and affect  Skin - warm and dry, normal color, no suspicious lesions noted  Chest - effort normal, all lung fields clear to auscultation bilaterally  Heart - normal rate and regular rhythm  Neck:  midline trachea, no thyromegaly or nodules  Breasts - breasts appear normal, no suspicious masses, no skin or nipple changes or  axillary nodes  Abdomen - soft, nontender, nondistended, no masses or organomegaly  Pelvic - VULVA: normal appearing vulva with no masses, tenderness or lesions  VAGINA: normal appearing vagina with normal color and discharge, no lesions  CERVIX: normal appearing cervix without discharge or lesions, no CMT  Thin prep pap is done with HR HPV cotesting  UTERUS: uterus is felt to be normal size, shape, consistency and nontender   ADNEXA: No adnexal masses or tenderness noted.  Rectal - no examined  Extremities:  No swelling or varicosities noted  Chaperone: Latisha Cresenzo    No results found for this or any previous visit (from the past 24 hour(s)).  Assessment & Plan:  1) Well-Woman Exam  2) cHTN, seems to be well-controlled on Norvasc and Zestoretic  Labs/procedures today: Pap  Mammogram: @ 40yo, or sooner if problems Colonoscopy: @ 39yo, or sooner  if problems  No orders of the defined types were placed in this encounter.   Meds: No orders of the defined types were placed in this encounter.   Follow-up: Return in about 1 year (around 08/12/2024) for Physical.  Arabella Merles CNM 08/13/2023 11:02 AM

## 2023-08-18 LAB — CYTOLOGY - PAP
Comment: NEGATIVE
Diagnosis: NEGATIVE
High risk HPV: NEGATIVE
